# Patient Record
Sex: Female | Born: 1985 | Race: Black or African American | Hispanic: No | Marital: Single | Smoking: Never smoker
Health system: Southern US, Community
[De-identification: ages and names within clinical notes are randomized; demographics above are authoritative.]

## PROBLEM LIST (undated history)

## (undated) ENCOUNTER — Inpatient Hospital Stay (HOSPITAL_COMMUNITY): Payer: Self-pay

## (undated) DIAGNOSIS — K219 Gastro-esophageal reflux disease without esophagitis: Secondary | ICD-10-CM

## (undated) DIAGNOSIS — D649 Anemia, unspecified: Secondary | ICD-10-CM

---
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
## 2006-03-04 ENCOUNTER — Emergency Department (HOSPITAL_COMMUNITY): Admission: EM | Admit: 2006-03-04 | Discharge: 2006-03-04 | Payer: Self-pay | Admitting: Emergency Medicine

## 2006-07-23 ENCOUNTER — Inpatient Hospital Stay (HOSPITAL_COMMUNITY): Admission: AD | Admit: 2006-07-23 | Discharge: 2006-07-23 | Payer: Self-pay | Admitting: Obstetrics & Gynecology

## 2006-08-18 ENCOUNTER — Ambulatory Visit (HOSPITAL_COMMUNITY): Admission: RE | Admit: 2006-08-18 | Discharge: 2006-08-18 | Payer: Self-pay | Admitting: Family Medicine

## 2006-09-15 ENCOUNTER — Ambulatory Visit (HOSPITAL_COMMUNITY): Admission: RE | Admit: 2006-09-15 | Discharge: 2006-09-15 | Payer: Self-pay | Admitting: Family Medicine

## 2006-09-29 ENCOUNTER — Ambulatory Visit (HOSPITAL_COMMUNITY): Admission: RE | Admit: 2006-09-29 | Discharge: 2006-09-29 | Payer: Self-pay | Admitting: Family Medicine

## 2006-10-24 ENCOUNTER — Inpatient Hospital Stay (HOSPITAL_COMMUNITY): Admission: AD | Admit: 2006-10-24 | Discharge: 2006-10-24 | Payer: Self-pay | Admitting: Obstetrics and Gynecology

## 2007-01-28 ENCOUNTER — Inpatient Hospital Stay (HOSPITAL_COMMUNITY): Admission: AD | Admit: 2007-01-28 | Discharge: 2007-01-28 | Payer: Self-pay | Admitting: Obstetrics and Gynecology

## 2007-01-28 ENCOUNTER — Ambulatory Visit: Payer: Self-pay | Admitting: *Deleted

## 2007-03-04 ENCOUNTER — Ambulatory Visit: Payer: Self-pay | Admitting: Obstetrics & Gynecology

## 2007-03-06 ENCOUNTER — Inpatient Hospital Stay (HOSPITAL_COMMUNITY): Admission: AD | Admit: 2007-03-06 | Discharge: 2007-03-07 | Payer: Self-pay | Admitting: Obstetrics and Gynecology

## 2007-03-06 ENCOUNTER — Ambulatory Visit: Payer: Self-pay | Admitting: Obstetrics and Gynecology

## 2007-03-08 ENCOUNTER — Inpatient Hospital Stay (HOSPITAL_COMMUNITY): Admission: AD | Admit: 2007-03-08 | Discharge: 2007-03-12 | Payer: Self-pay | Admitting: Obstetrics & Gynecology

## 2007-03-08 ENCOUNTER — Ambulatory Visit: Payer: Self-pay | Admitting: Obstetrics & Gynecology

## 2007-03-09 ENCOUNTER — Encounter: Payer: Self-pay | Admitting: Obstetrics & Gynecology

## 2007-03-14 ENCOUNTER — Inpatient Hospital Stay (HOSPITAL_COMMUNITY): Admission: AD | Admit: 2007-03-14 | Discharge: 2007-03-14 | Payer: Self-pay | Admitting: Gynecology

## 2008-07-13 ENCOUNTER — Emergency Department (HOSPITAL_COMMUNITY): Admission: EM | Admit: 2008-07-13 | Discharge: 2008-07-13 | Payer: Self-pay | Admitting: Emergency Medicine

## 2008-09-21 ENCOUNTER — Inpatient Hospital Stay (HOSPITAL_COMMUNITY): Admission: AD | Admit: 2008-09-21 | Discharge: 2008-09-21 | Payer: Self-pay | Admitting: Obstetrics & Gynecology

## 2009-03-13 ENCOUNTER — Inpatient Hospital Stay (HOSPITAL_COMMUNITY): Admission: AD | Admit: 2009-03-13 | Discharge: 2009-03-13 | Payer: Self-pay | Admitting: Obstetrics & Gynecology

## 2009-06-17 ENCOUNTER — Ambulatory Visit (HOSPITAL_COMMUNITY): Admission: RE | Admit: 2009-06-17 | Discharge: 2009-06-17 | Payer: Self-pay | Admitting: Obstetrics

## 2009-09-20 ENCOUNTER — Inpatient Hospital Stay (HOSPITAL_COMMUNITY): Admission: AD | Admit: 2009-09-20 | Discharge: 2009-09-21 | Payer: Self-pay | Admitting: Obstetrics

## 2009-09-20 ENCOUNTER — Ambulatory Visit: Payer: Self-pay | Admitting: Family

## 2009-11-02 ENCOUNTER — Inpatient Hospital Stay (HOSPITAL_COMMUNITY)
Admission: AD | Admit: 2009-11-02 | Discharge: 2009-11-06 | Payer: Self-pay | Source: Home / Self Care | Admitting: Obstetrics

## 2010-06-06 LAB — CBC
HCT: 21.1 % — ABNORMAL LOW (ref 36.0–46.0)
HCT: 27.9 % — ABNORMAL LOW (ref 36.0–46.0)
MCH: 30.4 pg (ref 26.0–34.0)
MCH: 30.6 pg (ref 26.0–34.0)
MCV: 89.8 fL (ref 78.0–100.0)
Platelets: 174 10*3/uL (ref 150–400)
RBC: 2.3 MIL/uL — ABNORMAL LOW (ref 3.87–5.11)
RBC: 3.1 MIL/uL — ABNORMAL LOW (ref 3.87–5.11)

## 2010-06-06 LAB — RPR: RPR Ser Ql: NONREACTIVE

## 2010-06-08 LAB — COMPREHENSIVE METABOLIC PANEL
Calcium: 8.8 mg/dL (ref 8.4–10.5)
Chloride: 106 mEq/L (ref 96–112)
GFR calc non Af Amer: >60 mL/min (ref >60)
Potassium: 3.2 mEq/L — ABNORMAL LOW (ref 3.5–5.1)
Total Bilirubin: 0.4 mg/dL (ref 0.3–1.2)

## 2010-06-08 LAB — CBC
HCT: 28.9 % — ABNORMAL LOW (ref 36.0–46.0)
Hemoglobin: 9.9 g/dL — ABNORMAL LOW (ref 12.0–15.0)
MCH: 30.7 pg (ref 26.0–34.0)
MCHC: 34.3 g/dL (ref 30.0–36.0)
MCV: 89.4 fL (ref 78.0–100.0)
WBC: 6.3 10*3/uL (ref 4.0–10.5)

## 2010-06-08 LAB — URINALYSIS, ROUTINE W REFLEX MICROSCOPIC
Glucose, UA: 100 mg/dL — AB
Hgb urine dipstick: NEGATIVE
Ketones, ur: 15 mg/dL — AB
Nitrite: NEGATIVE
Protein, ur: NEGATIVE mg/dL
Specific Gravity, Urine: 1.02 (ref 1.005–1.030)
pH: 6.5 (ref 5.0–8.0)

## 2010-06-08 LAB — URINE MICROSCOPIC-ADD ON

## 2010-06-23 LAB — URINALYSIS, ROUTINE W REFLEX MICROSCOPIC
Bilirubin Urine: NEGATIVE
Glucose, UA: NEGATIVE mg/dL
Nitrite: NEGATIVE
Protein, ur: NEGATIVE mg/dL
pH: 7 (ref 5.0–8.0)

## 2010-06-23 LAB — CBC
Platelets: 259 10*3/uL (ref 150–400)
RBC: 3.55 MIL/uL — ABNORMAL LOW (ref 3.87–5.11)
WBC: 7.6 10*3/uL (ref 4.0–10.5)

## 2010-06-23 LAB — COMPREHENSIVE METABOLIC PANEL
Albumin: 3.5 g/dL (ref 3.5–5.2)
BUN: 9 mg/dL (ref 6–23)
CO2: 26 mEq/L (ref 19–32)
Calcium: 9.3 mg/dL (ref 8.4–10.5)
GFR calc non Af Amer: >60 mL/min (ref >60)
Glucose, Bld: 91 mg/dL (ref 70–99)
Potassium: 3.7 mEq/L (ref 3.5–5.1)

## 2010-06-29 LAB — WET PREP, GENITAL: Yeast Wet Prep HPF POC: NONE SEEN

## 2010-06-29 LAB — POCT PREGNANCY, URINE: Preg Test, Ur: NEGATIVE

## 2010-06-29 LAB — GC/CHLAMYDIA PROBE AMP, GENITAL: Chlamydia, DNA Probe: NEGATIVE

## 2010-07-02 LAB — RAPID STREP SCREEN (MED CTR MEBANE ONLY): Streptococcus, Group A Screen (Direct): POSITIVE — AB

## 2010-08-05 NOTE — Discharge Summary (Signed)
Lori Leon, Lori Leon                  ACCOUNT NO.:  [id number]   MEDICAL RECORD NO.:  [id number]          PATIENT TYPE:  WOC   LOCATION:  WOC                          FACILITY:  [facility]   PHYSICIAN:  Ginger Carne, MD  DATE OF BIRTH:  Jun 26, 1985   DATE OF ADMISSION:  03/04/2007  DATE OF DISCHARGE:  03/04/2007                               DISCHARGE SUMMARY   REASON FOR HOSPITALIZATION:  Post-dates pregnancy for induction.   IN-HOSPITAL PROCEDURES:  Primary low transverse cesarean section.   FINAL DIAGNOSIS:  Primary low transverse cesarean section.   HOSPITAL COURSE:  This patient is a 25 year old gravida 1, para 33  female, [facility] March 04, 2007, who presented on March 08, 2007 for  induction of labor.  The patient progressed to 5 cm without progression,  and a primary cesarean section was performed by the attending on call.  Her postoperative course was complicated by hemoglobin that was 9.4  preoperatively, but remained stable at 6.9 postoperatively.  She was  asymptomatic and specifically had no complaints of dizziness or  difficulty ambulating.  Her abdomen was soft.  Her incision was dry.  Calves without tenderness.  Lungs Clear, and fundus was firm with scant  vaginal flow.  The patient chooses to use condoms and will be followed  at the health department for postpartum visit in 6 to 8 weeks.  Her  condition is stable at discharge.  Routine postoperative instructions  include contacting the office for temperature elevation above 100.4  degrees Fahrenheit.  Increasing abdominal incisional pain, incisional  drainage, heavy vaginal bleeding and/or GI or GU concerns.  She was  prescribed Percocet 5/325 one to two every 4 to 6 hours as needed for  pain, or the use of ibuprofen as needed and to continue her prenatal  vitamins.      Ginger Carne, MD  Electronically Signed     SHB/MEDQ  D:  03/12/2007  T:  03/13/2007  Job:  [id number]

## 2010-08-05 NOTE — Op Note (Signed)
Lori Leon, Lori Leon                  ACCOUNT NO.:  [id number]   MEDICAL RECORD NO.:  [id number]          PATIENT TYPE:  INP   LOCATION:  9120                          [facility]   PHYSICIAN:  Lazaro Arms, M.D.   DATE OF BIRTH:  09/29/1985   DATE OF PROCEDURE:  03/08/2007  DATE OF DISCHARGE:                               OPERATIVE REPORT   PREOPERATIVE DIAGNOSES:  1. Intrauterine pregnancy at 41 weeks of gestation.  2. Post dates induction.  3. Secondary arrest of dilatation and ascent in the active phase of      labor.   POSTOPERATIVE DIAGNOSES:  1. Intrauterine pregnancy at 41 weeks of gestation.  2. Post dates induction.  3. Secondary arrest of dilatation and ascent in the active phase of      labor.  4. Right occipital posterior presentation, asynclitic.   PROCEDURE:  Primary cesarean section after induction of labor.   SURGEON:  Lazaro Arms, M.D.   ANESTHESIA:  Epidural.   FINDINGS:  Over a low transverse hysterotomy incision was delivered a  viable female infant at 2009 with Apgars of 9 and 9.  Weight to be  determined in the nursery.  Cord pH 7.27.  There was a three-vessel  cord.  Cord blood and cord gas were sent.  The uterus, tubes, and  ovaries were normal.   DESCRIPTION OF PROCEDURE:  Patient was taken to the operating room and  placed in a supine position.  Her epidural had been placed previously,  and it was already dosed up to an adequate level.  She was prepped and  draped in the usual sterile fashion.  A Foley catheter had also been  placed previously.  A Pfannenstiel skin incision was made, carried down  sharply to the rectus fascia, which was scored in the midline and  extended laterally.  The fascia was taken off the muscles superiorly and  inferiorly without difficulty.  The muscles were divided, peritoneal  cavity was entered.  An Alexis self-retaining wound retractor was  placed.  The bladder blade was placed.  The vesicouterine serosal flap  was created.  A low transverse hysterotomy incision was made.  Over this  incision was delivered a viable female infant at 2009 with Apgars of 9 and  9 with the weight determined in the nursery.  There was a three-vessel  cord, cord blood, and cord gas sent.  The cord pH was 7.27.  The  placenta was delivered spontaneously.  The uterus was wiped clean with a  clean lap pad and exteriorized.  It was closed in two layers, the first  being a running, interlocking layer, the second being an imbricating  layer.  There was good hemostasis.  The pelvis was irrigated vigorously.  The uterus was replaced through the peritoneal cavity.  The muscles and  peritoneum reapproximated loosely.  The fascia closed using a 0 Vicryl  running.  The subcutaneous tissue was made  hemostatic and irrigated.  The skin was closed using skin staples.  The  patient tolerated the procedure well.  She experienced 700  cc of blood  loss.  She was taken to the recovery room in good, stable condition.  All counts were correct.  She received Ancef prophylactically.      Lazaro Arms, M.D.  Electronically Signed     LHE/MEDQ  D:  03/09/2007  T:  03/10/2007  Job:  [id number]

## 2010-12-26 LAB — CBC
HCT: 20.4 — ABNORMAL LOW
HCT: 23.2 — ABNORMAL LOW
Hemoglobin: 7.9 — CL
Hemoglobin: 9.4 — ABNORMAL LOW
MCHC: 34
MCHC: 34.2
MCHC: 34.2
MCV: 85.3
MCV: 85.4
Platelets: 168
Platelets: 183
Platelets: 227
RBC: 2.72 — ABNORMAL LOW
RBC: 3.23 — ABNORMAL LOW
RDW: 13.7
RDW: 13.8
WBC: 12.6 — ABNORMAL HIGH
WBC: 12.8 — ABNORMAL HIGH
WBC: 8.8

## 2010-12-30 LAB — URINALYSIS, ROUTINE W REFLEX MICROSCOPIC
Nitrite: NEGATIVE
Specific Gravity, Urine: 1.015
Urobilinogen, UA: 0.2
pH: 6

## 2010-12-30 LAB — WET PREP, GENITAL
Clue Cells Wet Prep HPF POC: NONE SEEN
Yeast Wet Prep HPF POC: NONE SEEN

## 2010-12-30 LAB — URINE MICROSCOPIC-ADD ON

## 2010-12-30 LAB — GC/CHLAMYDIA PROBE AMP, GENITAL: Chlamydia, DNA Probe: NEGATIVE

## 2012-02-10 ENCOUNTER — Emergency Department (HOSPITAL_COMMUNITY)
Admission: EM | Admit: 2012-02-10 | Discharge: 2012-02-11 | Disposition: A | Discharge status: Home / Self Care | Payer: Medicare Other | Attending: Emergency Medicine | Admitting: Emergency Medicine

## 2012-02-10 ENCOUNTER — Encounter (HOSPITAL_COMMUNITY): Payer: Self-pay | Admitting: Family Medicine

## 2012-02-10 DIAGNOSIS — R05 Cough: Secondary | ICD-10-CM | POA: Insufficient documentation

## 2012-02-10 DIAGNOSIS — R5381 Other malaise: Secondary | ICD-10-CM | POA: Insufficient documentation

## 2012-02-10 DIAGNOSIS — IMO0001 Reserved for inherently not codable concepts without codable children: Secondary | ICD-10-CM | POA: Insufficient documentation

## 2012-02-10 DIAGNOSIS — B9789 Other viral agents as the cause of diseases classified elsewhere: Secondary | ICD-10-CM | POA: Insufficient documentation

## 2012-02-10 DIAGNOSIS — M549 Dorsalgia, unspecified: Secondary | ICD-10-CM | POA: Insufficient documentation

## 2012-02-10 DIAGNOSIS — R51 Headache: Secondary | ICD-10-CM | POA: Insufficient documentation

## 2012-02-10 DIAGNOSIS — R059 Cough, unspecified: Secondary | ICD-10-CM | POA: Insufficient documentation

## 2012-02-10 DIAGNOSIS — B349 Viral infection, unspecified: Secondary | ICD-10-CM

## 2012-02-10 DIAGNOSIS — R509 Fever, unspecified: Secondary | ICD-10-CM | POA: Insufficient documentation

## 2012-02-10 LAB — URINALYSIS, ROUTINE W REFLEX MICROSCOPIC
Bilirubin Urine: NEGATIVE
Nitrite: NEGATIVE
Specific Gravity, Urine: 1.031 — ABNORMAL HIGH (ref 1.005–1.030)
Urobilinogen, UA: 1 mg/dL (ref 0.0–1.0)
pH: 6 (ref 5.0–8.0)

## 2012-02-10 LAB — URINE MICROSCOPIC-ADD ON

## 2012-02-10 MED ORDER — SODIUM CHLORIDE 0.9 % IV BOLUS (SEPSIS)
1000.0000 mL | Freq: Once | INTRAVENOUS | Status: AC
  Administered 2012-02-10: 1000 mL via INTRAVENOUS

## 2012-02-10 NOTE — ED Notes (Addendum)
Patient states that she has been sick since Sunday night. States she has had fever, chills, body aches, cough and headache. Reports having dizziness since Monday morning. Also reports pain in her neck.

## 2012-02-11 MED ORDER — IBUPROFEN 800 MG PO TABS
800.0000 mg | ORAL_TABLET | Freq: Once | ORAL | Status: AC
  Administered 2012-02-11: 800 mg via ORAL
  Filled 2012-02-11: qty 1

## 2012-02-11 NOTE — ED Provider Notes (Signed)
History     CSN: [id number]  Arrival date & time 02/10/12  2053   First MD Initiated Contact with Patient 02/10/12 2331      Chief Complaint  Patient presents with  . Dizziness   HPI  History provided by the patient. Patient is a 26 year old female with no significant PMH who presents with place of fever, chills, bodyaches, cough and headache began 3 nights ago. Symptoms have been waxing and waning and persistent. Patient has used some over-the-counter congestion and cold medicine without any significant relief. Patient also reports some recent feelings of increased fatigue and lightheadedness. Symptoms are worse with standing and moving around. She denies any near syncope or syncopal episode. She denies any chest pain, shortness of breath or heart palpitations. Cough has been nonproductive. She denies any associated vomiting or diarrhea symptoms. She has no recent travel. She is unsure of any specific sick contacts.     History reviewed. No pertinent past medical history.  Past Surgical History  Procedure Date  . Cesarean section     No family history on file.  History  Substance Use Topics  . Smoking status: Never Smoker   . Smokeless tobacco: Not on file  . Alcohol Use: No    OB History    Grav Para Term Preterm Abortions TAB SAB Ect Mult Living                  Review of Systems  Constitutional: Positive for fever, chills, appetite change and fatigue.  HENT: Negative for congestion, sore throat, rhinorrhea, neck pain and neck stiffness.   Respiratory: Positive for cough. Negative for shortness of breath.   Cardiovascular: Negative for chest pain.  Gastrointestinal: Negative for nausea, vomiting, abdominal pain, diarrhea and constipation.  Genitourinary: Negative for dysuria, frequency, hematuria, flank pain, vaginal bleeding and vaginal discharge.  Musculoskeletal: Positive for myalgias and back pain.  Skin: Negative for rash.  Neurological: Positive for  light-headedness and headaches.  All other systems reviewed and are negative.    Allergies  Review of patient's allergies indicates no known allergies.  Home Medications   Current Outpatient Rx  Name  Route  Sig  Dispense  Refill  . PSEUDOEPHEDRINE-APAP-DM 60-650-20 MG/30ML PO LIQD   Oral   Take 30 mLs by mouth as needed.           BP 111/65  Pulse 88  Temp 100.2 F (37.9 C) (Oral)  Resp 18  SpO2 98%  LMP 01/24/2012  Physical Exam  Nursing note and vitals reviewed. Constitutional: She is oriented to person, place, and time. She appears well-developed and well-nourished. No distress.  HENT:  Head: Normocephalic.  Mouth/Throat: Oropharynx is clear and moist.  Neck: Normal range of motion. Neck supple.       No meningeal signs  Cardiovascular: Normal rate and regular rhythm.   Pulmonary/Chest: Effort normal and breath sounds normal. No respiratory distress. She has no wheezes. She has no rales.  Abdominal: Soft. There is no tenderness. There is no rebound.  Musculoskeletal: She exhibits no edema and no tenderness.  Neurological: She is alert and oriented to person, place, and time.  Skin: Skin is warm and dry. No rash noted.  Psychiatric: She has a normal mood and affect. Her behavior is normal.    ED Course  Procedures   Results for orders placed during the hospital encounter of 02/10/12  URINALYSIS, ROUTINE W REFLEX MICROSCOPIC      Component Value Range   Color, Urine AMBER (*)  YELLOW   APPearance CLOUDY (*) CLEAR   Specific Gravity, Urine 1.031 (*) 1.005 - 1.030   pH 6.0  5.0 - 8.0   Glucose, UA NEGATIVE  NEGATIVE mg/dL   Hgb urine dipstick SMALL (*) NEGATIVE   Bilirubin Urine NEGATIVE  NEGATIVE   Ketones, ur TRACE (*) NEGATIVE mg/dL   Protein, ur 30 (*) NEGATIVE mg/dL   Urobilinogen, UA 1.0  0.0 - 1.0 mg/dL   Nitrite NEGATIVE  NEGATIVE   Leukocytes, UA MODERATE (*) NEGATIVE  POCT PREGNANCY, URINE      Component Value Range   Preg Test, Ur NEGATIVE   NEGATIVE  URINE MICROSCOPIC-ADD ON      Component Value Range   Squamous Epithelial / LPF RARE  RARE   WBC, UA 3-6  <3 WBC/hpf   RBC / HPF 3-6  <3 RBC/hpf   Bacteria, UA RARE  RARE       1. Viral infection       MDM  12:25 AM patient seen and evaluated. Patient appears well and nontoxic. Normal respirations and O2 sats.   Patient reports having improvement of symptoms after fluids. Temperature improved after ibuprofen. At this time suspect viral infection.     Angus Seller, [place] 02/12/12 [phone]

## 2012-02-12 NOTE — ED Provider Notes (Signed)
Medical screening examination/treatment/procedure(s) were performed by non-physician practitioner and as supervising physician I was immediately available for consultation/collaboration.  Strother Everitt M Kimball Manske, MD 02/12/12 0325 

## 2012-11-28 ENCOUNTER — Encounter (HOSPITAL_COMMUNITY): Payer: Self-pay | Admitting: *Deleted

## 2012-11-28 ENCOUNTER — Inpatient Hospital Stay (HOSPITAL_COMMUNITY)
Admission: AD | Admit: 2012-11-28 | Discharge: 2012-11-28 | Disposition: A | Discharge status: Home / Self Care | Payer: Medicare Other | Source: Ambulatory Visit | Attending: Obstetrics & Gynecology | Admitting: Obstetrics & Gynecology

## 2012-11-28 DIAGNOSIS — R0602 Shortness of breath: Secondary | ICD-10-CM | POA: Insufficient documentation

## 2012-11-28 DIAGNOSIS — R109 Unspecified abdominal pain: Secondary | ICD-10-CM | POA: Insufficient documentation

## 2012-11-28 DIAGNOSIS — Z3201 Encounter for pregnancy test, result positive: Secondary | ICD-10-CM

## 2012-11-28 DIAGNOSIS — O99891 Other specified diseases and conditions complicating pregnancy: Secondary | ICD-10-CM | POA: Insufficient documentation

## 2012-11-28 LAB — URINALYSIS, ROUTINE W REFLEX MICROSCOPIC
Glucose, UA: NEGATIVE mg/dL
Hgb urine dipstick: NEGATIVE
Leukocytes, UA: NEGATIVE
pH: 6 (ref 5.0–8.0)

## 2012-11-28 LAB — WET PREP, GENITAL
Clue Cells Wet Prep HPF POC: NONE SEEN
Trich, Wet Prep: NONE SEEN
Yeast Wet Prep HPF POC: NONE SEEN

## 2012-11-28 LAB — POCT PREGNANCY, URINE: Preg Test, Ur: POSITIVE — AB

## 2012-11-28 NOTE — MAU Note (Addendum)
Last couple wks, has gotten short of breath when going up the steps (not currently).  Feel drained. having pain in sides at times, past 1-2 wks, off and on (not currently- but earlier). No period in August.

## 2012-11-28 NOTE — MAU Provider Note (Signed)
History     CSN: [id number]  Arrival date and time: 11/28/12 1327   First Provider Initiated Contact with Patient 11/28/12 1438      Chief Complaint  Patient presents with  . Possible Pregnancy  . Shortness of Breath  . Abdominal Pain   HPI  Ms. Lori Leon is a 27 y.o. female; G3P2002 at [redacted]w[redacted]d who presents to MAU with "suspected pregnancy".  She has been experiencing abdominal cramping and very small amounts of vaginal spotting when she wipes. The pain and spotting have been going on for a few weeks. The spotting is light pink; She last saw the spotting 3-5 days ago. She is experiencing abdominal cramping most days; it hits her at night when she is lays in bed. She is not having any pain or bleeding today. She plans to see Dr. Elsie Leon office for prenatal care.  At times she has shortness of breath with increased activity and with changing positions; none currently, none today.   OB History   Grav Para Term Preterm Abortions TAB SAB Ect Mult Living   3 2 2  0 0 0 0 0 0 2      History reviewed. No pertinent past medical history.  Past Surgical History  Procedure Laterality Date  . Cesarean section      History reviewed. No pertinent family history.  History  Substance Use Topics  . Smoking status: Never Smoker   . Smokeless tobacco: Not on file  . Alcohol Use: No    Allergies: No Known Allergies  Prescriptions prior to admission  Medication Sig Dispense Refill  . acetaminophen (TYLENOL) 325 MG tablet Take 325 mg by mouth every 6 (six) hours as needed for pain (For headache.).      Marland Kitchen IRON PO Take 1 tablet by mouth 3 (three) times a week.       Results for orders placed during the hospital encounter of 11/28/12 (from the past 24 hour(s))  URINALYSIS, ROUTINE W REFLEX MICROSCOPIC     Status: Abnormal   Collection Time    11/28/12  2:03 PM      Result Value Range   Color, Urine YELLOW  YELLOW   APPearance HAZY (*) CLEAR   Specific Gravity, Urine 1.020  1.005 -  1.030   pH 6.0  5.0 - 8.0   Glucose, UA NEGATIVE  NEGATIVE mg/dL   Hgb urine dipstick NEGATIVE  NEGATIVE   Bilirubin Urine NEGATIVE  NEGATIVE   Ketones, ur NEGATIVE  NEGATIVE mg/dL   Protein, ur NEGATIVE  NEGATIVE mg/dL   Urobilinogen, UA 0.2  0.0 - 1.0 mg/dL   Nitrite NEGATIVE  NEGATIVE   Leukocytes, UA NEGATIVE  NEGATIVE  POCT PREGNANCY, URINE     Status: Abnormal   Collection Time    11/28/12  2:07 PM      Result Value Range   Preg Test, Ur POSITIVE (*) NEGATIVE  WET PREP, GENITAL     Status: Abnormal   Collection Time    11/28/12  3:00 PM      Result Value Range   Yeast Wet Prep HPF POC NONE SEEN  NONE SEEN   Trich, Wet Prep NONE SEEN  NONE SEEN   Clue Cells Wet Prep HPF POC NONE SEEN  NONE SEEN   WBC, Wet Prep HPF POC MANY (*) NONE SEEN    Review of Systems  Constitutional: Negative for fever and chills.  Gastrointestinal: Positive for nausea, vomiting, abdominal pain, diarrhea and constipation.  Left sided abdominal pain   Genitourinary: Negative for dysuria, urgency and frequency.  Neurological: Positive for dizziness and headaches.       Dizziness when standing from sitting    Physical Exam   Blood pressure 103/57, pulse 81, temperature 98 F (36.7 C), temperature source Oral, resp. rate 18, height 5' 4.5" (1.638 m), weight 89.359 kg (197 lb), last menstrual period 09/26/2012, SpO2 100.00%. Fetal heart tones 147 bpm by doppler   Physical Exam  Constitutional: She is oriented to person, place, and time. She appears well-developed and well-nourished. No distress.  Neck: Neck supple.  GI: Soft. She exhibits no distension. There is no tenderness. There is no rebound and no guarding.  Top of the fundus approximately 2 finger breaths below umbilicus   Genitourinary: No vaginal discharge found.  Speculum exam: Vagina - Small amount of creamy discharge, no odor.  Cervix - No contact bleeding Bimanual exam: Cervix closed Uterus non tender, normal size Adnexa non  tender, no masses bilaterally GC/Chlam, wet prep done Chaperone present for exam.   Neurological: She is alert and oriented to person, place, and time.  Skin: Skin is warm. She is not diaphoretic.    MAU Course  Procedures  MDM UA UPT +fht Wet prep GC/Chlamydia- pending    Assessment and Plan  A: Positive urine pregnancy test IUP; +fht    P: Discharge home Call Dr. Elsie Leon office to schedule your prenatal appointment  Come back to MAU tomorrow at 1500 for [place] for dating Take your prenatal vitamin daily.  Return with worsening symptoms Your pregnancy test is positive.  No smoking, no drugs, no alcohol.  Take a prenatal vitamin one by mouth every day.  Eat small frequent snacks to avoid nausea.  Begin prenatal care as soon as possible.  [name] IRENE FNP-C 11/28/2012, 6:43 PM

## 2012-11-28 NOTE — MAU Note (Signed)
Pt states some lower abdominal cramping at times.

## 2012-11-29 ENCOUNTER — Other Ambulatory Visit (HOSPITAL_COMMUNITY): Payer: Self-pay | Admitting: Obstetrics and Gynecology

## 2012-11-29 ENCOUNTER — Ambulatory Visit (HOSPITAL_COMMUNITY)
Admission: RE | Admit: 2012-11-29 | Discharge: 2012-11-29 | Disposition: A | Discharge status: Home / Self Care | Payer: Medicare Other | Source: Ambulatory Visit | Attending: Obstetrics and Gynecology | Admitting: Obstetrics and Gynecology

## 2012-11-29 ENCOUNTER — Ambulatory Visit (HOSPITAL_COMMUNITY)
Admit: 2012-11-29 | Discharge: 2012-11-29 | Disposition: A | Discharge status: Home / Self Care | Payer: Medicare Other | Attending: Obstetrics and Gynecology | Admitting: Obstetrics and Gynecology

## 2012-11-29 DIAGNOSIS — Z3201 Encounter for pregnancy test, result positive: Secondary | ICD-10-CM

## 2012-11-29 DIAGNOSIS — O34219 Maternal care for unspecified type scar from previous cesarean delivery: Secondary | ICD-10-CM | POA: Insufficient documentation

## 2012-11-29 DIAGNOSIS — Z3689 Encounter for other specified antenatal screening: Secondary | ICD-10-CM | POA: Insufficient documentation

## 2013-01-09 ENCOUNTER — Other Ambulatory Visit (HOSPITAL_COMMUNITY): Payer: Self-pay | Admitting: Nurse Practitioner

## 2013-01-09 DIAGNOSIS — Z3689 Encounter for other specified antenatal screening: Secondary | ICD-10-CM

## 2013-01-09 LAB — OB RESULTS CONSOLE HEPATITIS B SURFACE ANTIGEN: Hepatitis B Surface Ag: NEGATIVE

## 2013-01-09 LAB — OB RESULTS CONSOLE RUBELLA ANTIBODY, IGM: RUBELLA: NON-IMMUNE/NOT IMMUNE

## 2013-01-09 LAB — OB RESULTS CONSOLE RPR: RPR: NONREACTIVE

## 2013-01-09 LAB — OB RESULTS CONSOLE ANTIBODY SCREEN: Antibody Screen: NEGATIVE

## 2013-01-09 LAB — OB RESULTS CONSOLE ABO/RH: RH Type: POSITIVE

## 2013-01-09 LAB — OB RESULTS CONSOLE HIV ANTIBODY (ROUTINE TESTING): HIV: NONREACTIVE

## 2013-01-11 ENCOUNTER — Ambulatory Visit (HOSPITAL_COMMUNITY)
Admission: RE | Admit: 2013-01-11 | Discharge: 2013-01-11 | Disposition: A | Discharge status: Home / Self Care | Payer: Medicare Other | Source: Ambulatory Visit | Attending: Nurse Practitioner | Admitting: Nurse Practitioner

## 2013-01-11 ENCOUNTER — Other Ambulatory Visit (HOSPITAL_COMMUNITY): Payer: Self-pay | Admitting: Nurse Practitioner

## 2013-01-11 DIAGNOSIS — Z3689 Encounter for other specified antenatal screening: Secondary | ICD-10-CM | POA: Insufficient documentation

## 2013-01-11 DIAGNOSIS — O34219 Maternal care for unspecified type scar from previous cesarean delivery: Secondary | ICD-10-CM | POA: Insufficient documentation

## 2013-02-06 ENCOUNTER — Other Ambulatory Visit (HOSPITAL_COMMUNITY): Payer: Self-pay | Admitting: Nurse Practitioner

## 2013-02-06 DIAGNOSIS — Z3689 Encounter for other specified antenatal screening: Secondary | ICD-10-CM

## 2013-02-09 ENCOUNTER — Ambulatory Visit (HOSPITAL_COMMUNITY)
Admission: RE | Admit: 2013-02-09 | Discharge: 2013-02-09 | Disposition: A | Discharge status: Home / Self Care | Payer: Medicare Other | Source: Ambulatory Visit | Attending: Nurse Practitioner | Admitting: Nurse Practitioner

## 2013-02-09 ENCOUNTER — Ambulatory Visit (HOSPITAL_COMMUNITY): Admission: RE | Admit: 2013-02-09 | Payer: Medicare Other | Source: Ambulatory Visit

## 2013-02-09 DIAGNOSIS — O365990 Maternal care for other known or suspected poor fetal growth, unspecified trimester, not applicable or unspecified: Secondary | ICD-10-CM | POA: Insufficient documentation

## 2013-02-09 DIAGNOSIS — Z3689 Encounter for other specified antenatal screening: Secondary | ICD-10-CM | POA: Insufficient documentation

## 2013-03-10 ENCOUNTER — Other Ambulatory Visit: Payer: Self-pay | Admitting: Obstetrics & Gynecology

## 2013-04-05 ENCOUNTER — Encounter: Payer: Self-pay | Admitting: *Deleted

## 2013-04-20 ENCOUNTER — Other Ambulatory Visit (HOSPITAL_COMMUNITY): Payer: Self-pay | Admitting: Nurse Practitioner

## 2013-04-20 DIAGNOSIS — O321XX0 Maternal care for breech presentation, not applicable or unspecified: Secondary | ICD-10-CM

## 2013-04-21 ENCOUNTER — Ambulatory Visit (HOSPITAL_COMMUNITY)
Admission: RE | Admit: 2013-04-21 | Discharge: 2013-04-21 | Disposition: A | Discharge status: Home / Self Care | Payer: Medicare Other | Source: Ambulatory Visit | Attending: Nurse Practitioner | Admitting: Nurse Practitioner

## 2013-04-21 DIAGNOSIS — Z3689 Encounter for other specified antenatal screening: Secondary | ICD-10-CM | POA: Insufficient documentation

## 2013-04-21 DIAGNOSIS — O321XX0 Maternal care for breech presentation, not applicable or unspecified: Secondary | ICD-10-CM | POA: Insufficient documentation

## 2013-04-25 ENCOUNTER — Encounter (HOSPITAL_COMMUNITY): Payer: Self-pay | Admitting: Pharmacist

## 2013-05-04 ENCOUNTER — Encounter (HOSPITAL_COMMUNITY): Payer: Self-pay

## 2013-05-05 NOTE — Patient Instructions (Signed)
Your procedure is scheduled on: Wednesday, Feb, 18, 2015  [facility] at: 9:00am  Pick up the phone at the desk and dial [phone].  Call this number if you have problems the morning of surgery: [phone].  Remember: Do NOT eat food: AFTER MIDNIGHT TUESDAY Do NOT drink clear liquids after: AFTER MIDNIGHT TUESDAY  Take these medicines the morning of surgery with a SIP OF WATER: NONE  Do NOT wear jewelry (body piercing), make-up, or nail polish. Do NOT wear lotions, powders, or perfumes.  You may wear deoderant. Do NOT shave for 48 hours prior to surgery. Do NOT bring valuables to the hospital. Contacts, dentures, or bridgework may not be worn into surgery. Leave suitcase in car.  After surgery it may be brought to your room.  For patients admitted to the hospital, checkout time is 11:00 AM the day of discharge.

## 2013-05-08 ENCOUNTER — Encounter (HOSPITAL_COMMUNITY)
Admission: RE | Admit: 2013-05-08 | Discharge: 2013-05-08 | Disposition: A | Discharge status: Home / Self Care | Payer: Medicare Other | Source: Ambulatory Visit | Attending: Obstetrics & Gynecology | Admitting: Obstetrics & Gynecology

## 2013-05-08 ENCOUNTER — Encounter (HOSPITAL_COMMUNITY): Payer: Self-pay

## 2013-05-08 ENCOUNTER — Inpatient Hospital Stay (HOSPITAL_COMMUNITY)
Admission: RE | Admit: 2013-05-08 | Discharge: 2013-05-12 | DRG: 766 | Disposition: A | Discharge status: Home / Self Care | Payer: Medicare Other | Source: Ambulatory Visit | Attending: Obstetrics & Gynecology | Admitting: Obstetrics & Gynecology

## 2013-05-08 DIAGNOSIS — O9902 Anemia complicating childbirth: Secondary | ICD-10-CM | POA: Diagnosis present

## 2013-05-08 DIAGNOSIS — D649 Anemia, unspecified: Secondary | ICD-10-CM | POA: Diagnosis present

## 2013-05-08 DIAGNOSIS — O34219 Maternal care for unspecified type scar from previous cesarean delivery: Principal | ICD-10-CM | POA: Diagnosis present

## 2013-05-08 DIAGNOSIS — K219 Gastro-esophageal reflux disease without esophagitis: Secondary | ICD-10-CM | POA: Diagnosis present

## 2013-05-08 HISTORY — DX: Anemia, unspecified: D64.9

## 2013-05-08 HISTORY — DX: Gastro-esophageal reflux disease without esophagitis: K21.9

## 2013-05-08 LAB — CBC
HEMATOCRIT: 26.4 % — AB (ref 36.0–46.0)
HEMOGLOBIN: 8.9 g/dL — AB (ref 12.0–15.0)
MCH: 29 pg (ref 26.0–34.0)
MCHC: 33.7 g/dL (ref 30.0–36.0)
MCV: 86 fL (ref 78.0–100.0)
Platelets: 146 10*3/uL — ABNORMAL LOW (ref 150–400)
RBC: 3.07 MIL/uL — AB (ref 3.87–5.11)
RDW: 13.3 % (ref 11.5–15.5)
WBC: 7.7 10*3/uL (ref 4.0–10.5)

## 2013-05-08 LAB — ABO/RH: ABO/RH(D): A POS

## 2013-05-08 LAB — TYPE AND SCREEN
ABO/RH(D): A POS
Antibody Screen: NEGATIVE

## 2013-05-08 LAB — SYPHILIS: RPR W/REFLEX TO RPR TITER AND TREPONEMAL ANTIBODIES, TRADITIONAL SCREENING AND DIAGNOSIS ALGORITHM: RPR Ser Ql: NONREACTIVE

## 2013-05-08 NOTE — Pre-Procedure Instructions (Signed)
Dr. Maple HudsonMoser made aware of patients hemoglobin 8.9, hematocrit 26.4, and platelets 146 no new orders received at this time.

## 2013-05-10 ENCOUNTER — Encounter (HOSPITAL_COMMUNITY)
Admission: RE | Disposition: A | Discharge status: Home / Self Care | Payer: Self-pay | Source: Ambulatory Visit | Attending: Obstetrics & Gynecology

## 2013-05-10 ENCOUNTER — Encounter (HOSPITAL_COMMUNITY): Payer: Self-pay

## 2013-05-10 ENCOUNTER — Inpatient Hospital Stay (HOSPITAL_COMMUNITY): Payer: Medicare Other | Admitting: Anesthesiology

## 2013-05-10 ENCOUNTER — Encounter (HOSPITAL_COMMUNITY): Payer: Medicare Other | Admitting: Anesthesiology

## 2013-05-10 DIAGNOSIS — Z302 Encounter for sterilization: Secondary | ICD-10-CM

## 2013-05-10 DIAGNOSIS — O34219 Maternal care for unspecified type scar from previous cesarean delivery: Secondary | ICD-10-CM

## 2013-05-10 SURGERY — Surgical Case
Anesthesia: Spinal | Site: Abdomen | Laterality: Bilateral

## 2013-05-10 MED ORDER — MENTHOL 3 MG MT LOZG: 1.0000 | LOZENGE | OROMUCOSAL | Status: DC | PRN

## 2013-05-10 MED ORDER — CEFAZOLIN SODIUM-DEXTROSE 2-3 GM-% IV SOLR
2.0000 g | INTRAVENOUS | Status: AC
  Administered 2013-05-10: 2 g via INTRAVENOUS

## 2013-05-10 MED ORDER — KETOROLAC TROMETHAMINE 30 MG/ML IJ SOLN
30.0000 mg | Freq: Four times a day (QID) | INTRAMUSCULAR | Status: AC | PRN
  Administered 2013-05-10: 30 mg via INTRAMUSCULAR

## 2013-05-10 MED ORDER — SIMETHICONE 80 MG PO CHEW
80.0000 mg | CHEWABLE_TABLET | ORAL | Status: DC
  Administered 2013-05-11 (×2): 80 mg via ORAL
  Filled 2013-05-10 (×2): qty 1

## 2013-05-10 MED ORDER — LACTATED RINGERS IV SOLN
Freq: Once | INTRAVENOUS | Status: AC
  Administered 2013-05-10: 10:00:00 via INTRAVENOUS

## 2013-05-10 MED ORDER — MORPHINE SULFATE (PF) 0.5 MG/ML IJ SOLN
INTRAMUSCULAR | Status: DC | PRN
  Administered 2013-05-10: .2 mg via INTRATHECAL

## 2013-05-10 MED ORDER — METOCLOPRAMIDE HCL 5 MG/ML IJ SOLN: 10.0000 mg | Freq: Three times a day (TID) | INTRAMUSCULAR | Status: DC | PRN

## 2013-05-10 MED ORDER — NALOXONE HCL 1 MG/ML IJ SOLN
1.0000 ug/kg/h | INTRAVENOUS | Status: DC | PRN
  Filled 2013-05-10: qty 2

## 2013-05-10 MED ORDER — MEPERIDINE HCL 25 MG/ML IJ SOLN: 6.2500 mg | INTRAMUSCULAR | Status: DC | PRN

## 2013-05-10 MED ORDER — PHENYLEPHRINE HCL 10 MG/ML IJ SOLN
INTRAMUSCULAR | Status: AC
  Filled 2013-05-10: qty 1

## 2013-05-10 MED ORDER — NALBUPHINE HCL 10 MG/ML IJ SOLN: 5.0000 mg | INTRAMUSCULAR | Status: DC | PRN

## 2013-05-10 MED ORDER — SCOPOLAMINE 1 MG/3DAYS TD PT72
1.0000 | MEDICATED_PATCH | Freq: Once | TRANSDERMAL | Status: DC
  Administered 2013-05-10: 1.5 mg via TRANSDERMAL

## 2013-05-10 MED ORDER — KETOROLAC TROMETHAMINE 30 MG/ML IJ SOLN: 30.0000 mg | Freq: Four times a day (QID) | INTRAMUSCULAR | Status: AC | PRN

## 2013-05-10 MED ORDER — PRENATAL MULTIVITAMIN CH
1.0000 | ORAL_TABLET | Freq: Every day | ORAL | Status: DC
  Administered 2013-05-11: 1 via ORAL
  Filled 2013-05-10: qty 1

## 2013-05-10 MED ORDER — FENTANYL CITRATE 0.05 MG/ML IJ SOLN: 25.0000 ug | INTRAMUSCULAR | Status: DC | PRN

## 2013-05-10 MED ORDER — SODIUM CHLORIDE 0.9 % IJ SOLN: 3.0000 mL | INTRAMUSCULAR | Status: DC | PRN

## 2013-05-10 MED ORDER — SENNOSIDES-DOCUSATE SODIUM 8.6-50 MG PO TABS
2.0000 | ORAL_TABLET | ORAL | Status: DC
  Administered 2013-05-11 (×2): 2 via ORAL
  Filled 2013-05-10 (×2): qty 2

## 2013-05-10 MED ORDER — MORPHINE SULFATE 0.5 MG/ML IJ SOLN
INTRAMUSCULAR | Status: AC
  Filled 2013-05-10: qty 10

## 2013-05-10 MED ORDER — SCOPOLAMINE 1 MG/3DAYS TD PT72
1.0000 | MEDICATED_PATCH | Freq: Once | TRANSDERMAL | Status: DC
  Filled 2013-05-10: qty 1

## 2013-05-10 MED ORDER — LACTATED RINGERS IV SOLN
INTRAVENOUS | Status: DC
  Administered 2013-05-10: 17:00:00 via INTRAVENOUS

## 2013-05-10 MED ORDER — ONDANSETRON HCL 4 MG/2ML IJ SOLN: 4.0000 mg | Freq: Three times a day (TID) | INTRAMUSCULAR | Status: DC | PRN

## 2013-05-10 MED ORDER — DIPHENHYDRAMINE HCL 50 MG/ML IJ SOLN: 12.5000 mg | INTRAMUSCULAR | Status: DC | PRN

## 2013-05-10 MED ORDER — FENTANYL CITRATE 0.05 MG/ML IJ SOLN
INTRAMUSCULAR | Status: DC | PRN
  Administered 2013-05-10: 12.5 ug via INTRATHECAL

## 2013-05-10 MED ORDER — KETOROLAC TROMETHAMINE 30 MG/ML IJ SOLN
INTRAMUSCULAR | Status: AC
  Administered 2013-05-10: 30 mg via INTRAMUSCULAR
  Filled 2013-05-10: qty 1

## 2013-05-10 MED ORDER — DIBUCAINE 1 % RE OINT: 1.0000 "application " | TOPICAL_OINTMENT | RECTAL | Status: DC | PRN

## 2013-05-10 MED ORDER — LIDOCAINE 1%/NA BICARB 0.1 MEQ INJECTION
INJECTION | INTRAVENOUS | Status: AC
  Filled 2013-05-10: qty 1

## 2013-05-10 MED ORDER — DIPHENHYDRAMINE HCL 25 MG PO CAPS: 25.0000 mg | ORAL_CAPSULE | ORAL | Status: DC | PRN

## 2013-05-10 MED ORDER — LACTATED RINGERS IV SOLN
INTRAVENOUS | Status: DC
  Administered 2013-05-10 (×2): via INTRAVENOUS

## 2013-05-10 MED ORDER — SIMETHICONE 80 MG PO CHEW
80.0000 mg | CHEWABLE_TABLET | ORAL | Status: DC | PRN
Start: 2013-05-10 — End: 2013-05-12

## 2013-05-10 MED ORDER — ONDANSETRON HCL 4 MG/2ML IJ SOLN
INTRAMUSCULAR | Status: AC
  Filled 2013-05-10: qty 2

## 2013-05-10 MED ORDER — IBUPROFEN 600 MG PO TABS
600.0000 mg | ORAL_TABLET | Freq: Four times a day (QID) | ORAL | Status: DC
  Administered 2013-05-10 – 2013-05-12 (×7): 600 mg via ORAL
  Filled 2013-05-10 (×7): qty 1

## 2013-05-10 MED ORDER — ONDANSETRON HCL 4 MG/2ML IJ SOLN: 4.0000 mg | INTRAMUSCULAR | Status: DC | PRN

## 2013-05-10 MED ORDER — DIPHENHYDRAMINE HCL 25 MG PO CAPS: 25.0000 mg | ORAL_CAPSULE | Freq: Four times a day (QID) | ORAL | Status: DC | PRN

## 2013-05-10 MED ORDER — DIPHENHYDRAMINE HCL 50 MG/ML IJ SOLN: 25.0000 mg | INTRAMUSCULAR | Status: DC | PRN

## 2013-05-10 MED ORDER — WITCH HAZEL-GLYCERIN EX PADS: 1.0000 "application " | MEDICATED_PAD | CUTANEOUS | Status: DC | PRN

## 2013-05-10 MED ORDER — NALOXONE HCL 0.4 MG/ML IJ SOLN: 0.4000 mg | INTRAMUSCULAR | Status: DC | PRN

## 2013-05-10 MED ORDER — OXYCODONE-ACETAMINOPHEN 5-325 MG PO TABS
1.0000 | ORAL_TABLET | ORAL | Status: DC | PRN
  Administered 2013-05-11: 1 via ORAL
  Filled 2013-05-10: qty 1

## 2013-05-10 MED ORDER — SCOPOLAMINE 1 MG/3DAYS TD PT72
MEDICATED_PATCH | TRANSDERMAL | Status: AC
  Administered 2013-05-10: 1.5 mg via TRANSDERMAL
  Filled 2013-05-10: qty 1

## 2013-05-10 MED ORDER — OXYTOCIN 10 UNIT/ML IJ SOLN
INTRAMUSCULAR | Status: AC
  Filled 2013-05-10: qty 4

## 2013-05-10 MED ORDER — OXYTOCIN 10 UNIT/ML IJ SOLN
40.0000 [IU] | INTRAVENOUS | Status: DC | PRN
  Administered 2013-05-10: 40 [IU] via INTRAVENOUS

## 2013-05-10 MED ORDER — ZOLPIDEM TARTRATE 5 MG PO TABS: 5.0000 mg | ORAL_TABLET | Freq: Every evening | ORAL | Status: DC | PRN

## 2013-05-10 MED ORDER — ONDANSETRON HCL 4 MG PO TABS: 4.0000 mg | ORAL_TABLET | ORAL | Status: DC | PRN

## 2013-05-10 MED ORDER — TETANUS-DIPHTH-ACELL PERTUSSIS 5-2.5-18.5 LF-MCG/0.5 IM SUSP
0.5000 mL | Freq: Once | INTRAMUSCULAR | Status: AC
  Administered 2013-05-12: 0.5 mL via INTRAMUSCULAR
  Filled 2013-05-10: qty 0.5

## 2013-05-10 MED ORDER — BUPIVACAINE HCL (PF) 0.25 % IJ SOLN
INTRAMUSCULAR | Status: AC
  Filled 2013-05-10: qty 30

## 2013-05-10 MED ORDER — LACTATED RINGERS IV SOLN
INTRAVENOUS | Status: DC
  Administered 2013-05-10: 11:00:00 via INTRAVENOUS

## 2013-05-10 MED ORDER — FENTANYL CITRATE 0.05 MG/ML IJ SOLN
INTRAMUSCULAR | Status: AC
  Filled 2013-05-10: qty 2

## 2013-05-10 MED ORDER — PHENYLEPHRINE 8 MG IN D5W 100 ML (0.08MG/ML) PREMIX OPTIME
INJECTION | INTRAVENOUS | Status: DC | PRN
  Administered 2013-05-10 (×2): 60 ug/min via INTRAVENOUS

## 2013-05-10 MED ORDER — BUPIVACAINE HCL 0.25 % IJ SOLN
INTRAMUSCULAR | Status: DC | PRN
  Administered 2013-05-10: 30 mL

## 2013-05-10 MED ORDER — BUPIVACAINE IN DEXTROSE 0.75-8.25 % IT SOLN
INTRATHECAL | Status: DC | PRN
Start: 2013-05-10 — End: 2013-05-10
  Administered 2013-05-10: 1.8 mL via INTRATHECAL

## 2013-05-10 MED ORDER — LANOLIN HYDROUS EX OINT: 1.0000 "application " | TOPICAL_OINTMENT | CUTANEOUS | Status: DC | PRN

## 2013-05-10 MED ORDER — SIMETHICONE 80 MG PO CHEW
80.0000 mg | CHEWABLE_TABLET | Freq: Three times a day (TID) | ORAL | Status: DC
  Administered 2013-05-10 – 2013-05-12 (×5): 80 mg via ORAL
  Filled 2013-05-10 (×5): qty 1

## 2013-05-10 MED ORDER — ONDANSETRON HCL 4 MG/2ML IJ SOLN
INTRAMUSCULAR | Status: DC | PRN
  Administered 2013-05-10: 4 mg via INTRAVENOUS

## 2013-05-10 MED ORDER — OXYTOCIN 40 UNITS IN LACTATED RINGERS INFUSION - SIMPLE MED: 62.5000 mL/h | INTRAVENOUS | Status: AC

## 2013-05-10 SURGICAL SUPPLY — 30 items
BARRIER ADHS 3X4 INTERCEED (GAUZE/BANDAGES/DRESSINGS) IMPLANT
CLAMP CORD UMBIL (MISCELLANEOUS) IMPLANT
CLIP FILSHIE TUBAL LIGA STRL (Clip) ×3 IMPLANT
CLOTH BEACON ORANGE TIMEOUT ST (SAFETY) ×3 IMPLANT
DRAPE LG THREE QUARTER DISP (DRAPES) IMPLANT
DRSG OPSITE POSTOP 4X10 (GAUZE/BANDAGES/DRESSINGS) ×3 IMPLANT
DURAPREP 26ML APPLICATOR (WOUND CARE) ×3 IMPLANT
ELECT REM PT RETURN 9FT ADLT (ELECTROSURGICAL) ×3
ELECTRODE REM PT RTRN 9FT ADLT (ELECTROSURGICAL) ×1 IMPLANT
EXTRACTOR VACUUM KIWI (MISCELLANEOUS) IMPLANT
GLOVE BIO SURGEON STRL SZ 6.5 (GLOVE) ×2 IMPLANT
GLOVE BIO SURGEONS STRL SZ 6.5 (GLOVE) ×1
GLOVE BIOGEL PI IND STRL 7.0 (GLOVE) ×1 IMPLANT
GLOVE BIOGEL PI INDICATOR 7.0 (GLOVE) ×2
GOWN STRL REUS W/TWL LRG LVL3 (GOWN DISPOSABLE) ×6 IMPLANT
KIT ABG SYR 3ML LUER SLIP (SYRINGE) IMPLANT
NEEDLE HYPO 22GX1.5 SAFETY (NEEDLE) ×3 IMPLANT
NEEDLE HYPO 25X5/8 SAFETYGLIDE (NEEDLE) IMPLANT
NS IRRIG 1000ML POUR BTL (IV SOLUTION) ×3 IMPLANT
PACK C SECTION WH (CUSTOM PROCEDURE TRAY) ×3 IMPLANT
PAD OB MATERNITY 4.3X12.25 (PERSONAL CARE ITEMS) ×3 IMPLANT
RTRCTR C-SECT PINK 25CM LRG (MISCELLANEOUS) ×3 IMPLANT
SUT VIC AB 0 CT1 36 (SUTURE) ×12 IMPLANT
SUT VIC AB 2-0 CT1 27 (SUTURE) ×2
SUT VIC AB 2-0 CT1 TAPERPNT 27 (SUTURE) ×1 IMPLANT
SUT VIC AB 4-0 PS2 27 (SUTURE) ×3 IMPLANT
SYR CONTROL 10ML LL (SYRINGE) ×3 IMPLANT
TOWEL OR 17X24 6PK STRL BLUE (TOWEL DISPOSABLE) ×3 IMPLANT
TRAY FOLEY CATH 14FR (SET/KITS/TRAYS/PACK) IMPLANT
WATER STERILE IRR 1000ML POUR (IV SOLUTION) ×3 IMPLANT

## 2013-05-10 NOTE — Addendum Note (Signed)
Addendum created 05/10/13 1544 by Algis GreenhouseLinda A Neka Bise, CRNA   Modules edited: Notes Section   Notes Section:  File: [id number]

## 2013-05-10 NOTE — Op Note (Signed)
Cesarean Section Procedure Note   Lori Leon  05/10/2013  Indications: Scheduled Proceedure/Maternal Request   Pre-operative Diagnosis:  REPEAT c/section; desires sterilization.   Post-operative Diagnosis: Same   Surgeon: Surgeon(s) and Role:    * Lori PhenixJames G Estephania Licciardi, MD - Primary   Assistants: none  Anesthesia: spinal   Procedure Details:  The patient was seen in the Holding Room. The risks, benefits, complications, treatment options, and expected outcomes were discussed with the patient. The patient concurred with the proposed plan, giving informed consent. identified as Lori Leon and the procedure verified as C-Section Delivery. A Time Out was held and the above information confirmed.  After induction of anesthesia, the patient was draped and prepped in the usual sterile manner. A transverse was made and carried down through the subcutaneous tissue to the fascia. Fascial incision was made and extended transversely. The fascia was separated from the underlying rectus tissue superiorly and inferiorly. The peritoneum was identified and entered. Peritoneal incision was extended longitudinally. The utero-vesical peritoneal reflection was incised transversely and the bladder flap was bluntly freed from the lower uterine segment. A low transverse uterine incision was made. Delivered from cephalic presentation was a 3415 gram Female with Apgar scores of 9 at one minute and 9 at five minutes. Cord ph was not sent the umbilical cord was clamped and cut cord blood was obtained for evaluation. The placenta was removed Intact and appeared normal. The uterine outline, tubes and ovaries appeared normal}. The uterine incision was closed with running locked sutures of 0coated Vicryl, and imbricating layer followed.   Hemostasis was observed. Both adnexa were inspected and the fallopian tubes were identified and elevated and Filshie clips were applied for sterilization.  The peritoneum was closed with a running  suture with 2-0 Vicryl. The fascia was then reapproximated with running sutures of 0coated Vicryl. The subcuticular closure was performed using 4-0coated Vicryl. Sterile dressing was applied  Instrument, sponge, and needle counts were correct prior the abdominal closure and were correct at the conclusion of the case.    Findings:   Estimated Blood Loss: * No blood loss amount entered * 700 ml  Total IV Fluids: 1000ml   Urine Output: 100CC OF clear urine  Specimens: none  Complications: no complications  Disposition: PACU - hemodynamically stable.   Maternal Condition: stable   Baby condition / location:  Couplet care / Skin to Skin  Attending Attestation: I was present and scrubbed for the entire procedure.   Signed: Surgeon(s): Lori PhenixJames G Krissy Orebaugh, MD 05/10/2013

## 2013-05-10 NOTE — Transfer of Care (Signed)
Immediate Anesthesia Transfer of Care Note  Patient: Lori Leon  Procedure(s) Performed: Procedure(s): REPEAT CESAREAN SECTION with Bilateral Tubal Ligation (Bilateral)  Patient Location: PACU  Anesthesia Type:Spinal  Level of Consciousness: awake, alert , oriented and patient cooperative  Airway & Oxygen Therapy: Patient Spontanous Breathing  Post-op Assessment: Report given to PACU RN and Post -op Vital signs reviewed and stable  Post vital signs: Reviewed and stable  Complications: No apparent anesthesia complications

## 2013-05-10 NOTE — Anesthesia Procedure Notes (Signed)
Spinal  Patient location during procedure: OR Start time: 05/10/2013 10:42 AM End time: 05/10/2013 11:47 AM Staffing Anesthesiologist: MOSER, CHRIS Performed by: anesthesiologist  Preanesthetic Checklist Completed: patient identified, surgical consent, pre-op evaluation, timeout performed, IV checked, risks and benefits discussed and monitors and equipment checked Spinal Block Patient position: sitting Prep: site prepped and draped and DuraPrep Patient monitoring: heart rate, cardiac monitor, continuous pulse ox and blood pressure Approach: midline Location: L3-4 Injection technique: single-shot Needle Needle type: Pencan  Needle gauge: 24 G Needle length: 10 cm Assessment Sensory level: T4

## 2013-05-10 NOTE — Progress Notes (Signed)
UR completed 

## 2013-05-10 NOTE — Anesthesia Preprocedure Evaluation (Signed)
Anesthesia Evaluation  Patient identified by MRN, date of birth, ID band Patient awake    Reviewed: Allergy & Precautions, H&P , NPO status , Patient's Chart, lab work & pertinent test results  History of Anesthesia Complications Negative for: history of anesthetic complications  Airway Mallampati: II TM Distance: >3 FB Neck ROM: Full    Dental  (+) Teeth Intact   Pulmonary neg pulmonary ROS,    Pulmonary exam normal       Cardiovascular negative cardio ROS  Rhythm:Regular Rate:Normal     Neuro/Psych negative neurological ROS  negative psych ROS   GI/Hepatic negative GI ROS, Neg liver ROS,   Endo/Other  negative endocrine ROS  Renal/GU negative Renal ROS     Musculoskeletal negative musculoskeletal ROS (+)   Abdominal   Peds  Hematology  (+) anemia ,   Anesthesia Other Findings   Reproductive/Obstetrics (+) Pregnancy                           Anesthesia Physical Anesthesia Plan  ASA: II  Anesthesia Plan: Spinal   Post-op Pain Management:    Induction:   Airway Management Planned: Natural Airway  Additional Equipment: None  Intra-op Plan:   Post-operative Plan:   Informed Consent: I have reviewed the patients History and Physical, chart, labs and discussed the procedure including the risks, benefits and alternatives for the proposed anesthesia with the patient or authorized representative who has indicated his/her understanding and acceptance.   Dental advisory given  Plan Discussed with: CRNA and Surgeon  Anesthesia Plan Comments:         Anesthesia Quick Evaluation

## 2013-05-10 NOTE — H&P (Signed)
Lori Leon is a 28 y.o. female presenting for repeat cesarean section and bilateral tubal ligation. Maternal Medical History:  Reason for admission: Repeat cesarean section and BTL at 39 weeks  Fetal activity: Perceived fetal activity is normal.    Prenatal complications: Late to care, 2 previous cesarean sections    OB History   Grav Para Term Preterm Abortions TAB SAB Ect Mult Living   3 2 2  0 0 0 0 0 0 2     Past Medical History  Diagnosis Date  . GERD (gastroesophageal reflux disease)   . Anemia    Past Surgical History  Procedure Laterality Date  . Cesarean section     Family History: family history is not on file. Social History:  reports that she has never smoked. She does not have any smokeless tobacco history on file. She reports that she does not drink alcohol or use illicit drugs.   Prenatal Transfer Tool  Maternal Diabetes: No Genetic Screening: Declined Maternal Ultrasounds/Referrals: Normal Fetal Ultrasounds or other Referrals:  None Maternal Substance Abuse:  No Significant Maternal Medications:  None Significant Maternal Lab Results:  None Other Comments:  None  Review of Systems  Constitutional: Negative for fever.  Respiratory: Negative.   Gastrointestinal: Negative.   Genitourinary: Negative.   Musculoskeletal: Negative.       Blood pressure 119/65, pulse 93, temperature 97.7 F (36.5 C), temperature source Oral, resp. rate 16, last menstrual period 08/03/2012, SpO2 95.00%. Maternal Exam:  Abdomen: Patient reports no abdominal tenderness. Surgical scars: low transverse.   Fetal presentation: vertex  Pelvis: Not examined Cervix: not evaluated.   Physical Exam  Constitutional: She appears well-developed. No distress.  Cardiovascular: Normal rate.   Respiratory: Breath sounds normal.  GI: Soft.  Musculoskeletal: Normal range of motion.  Neurological: She is alert.  Skin: Skin is warm and dry.  Psychiatric: She has a normal mood and  affect. Her behavior is normal.    Prenatal labs: ABO, Rh: --/--/A POS (02/16 1139) Antibody: NEG (02/16 1139) Rubella: Nonimmune (10/20 1006) RPR: NON REACTIVE (02/16 1138)  HBsAg: Negative (10/20 1006)  HIV: Non-reactive (10/20 1006)  GBS:   negative  Assessment/Plan: Scheduled for repeat cesarean section and BTL. The procedure and the risk of anesthesia, bleeding, infection, bowel and bladder injury, failure (1/200) and ectopic pregnancy were discussed and her questions were answered.    Lori Leon 05/10/2013, 9:35 AM

## 2013-05-10 NOTE — Anesthesia Postprocedure Evaluation (Signed)
  Anesthesia Post-op Note  Patient: Lori Leon  Procedure(s) Performed: Procedure(s): REPEAT CESAREAN SECTION with Bilateral Tubal Ligation (Bilateral)  Patient Location: PACU  Anesthesia Type:Spinal  Level of Consciousness: awake, alert  and oriented  Airway and Oxygen Therapy: Patient Spontanous Breathing  Post-op Pain: mild  Post-op Assessment: Post-op Vital signs reviewed, Patient's Cardiovascular Status Stable, Respiratory Function Stable, Patent Airway, No signs of Nausea or vomiting and Pain level controlled  Post-op Vital Signs: Reviewed and stable  Complications: No apparent anesthesia complications

## 2013-05-10 NOTE — Anesthesia Postprocedure Evaluation (Signed)
Anesthesia Post Note  Patient: Lori Leon  Procedure(s) Performed: Procedure(s) (LRB): REPEAT CESAREAN SECTION with Bilateral Tubal Ligation (Bilateral)  Anesthesia type: Spinal  Patient location: Mother/Baby  Post pain: Pain level controlled  Post assessment: Post-op Vital signs reviewed  Last Vitals:  Filed Vitals:   05/10/13 1520  BP: 103/63  Pulse: 80  Temp: 36.3 C  Resp: 18    Post vital signs: Reviewed  Level of consciousness: awake  Complications: No apparent anesthesia complications

## 2013-05-10 NOTE — Lactation Note (Signed)
This note was copied from the chart of Lori Saintclair Halstedmily Lapaglia. Lactation Consultation Note Initial visit at 10 hours of age.  Mom reports a few feedings, she just attempted and baby is sleepy.  Previous latch scores of "7--8" Baby has voided and stooled.  She sucked a few times and back to sleep.  Discussed early feeding cues and STS. Demonstrated hand expression with colostrum visible.  [facility] given and discussed.  Feeding frequency discussed and referenced "baby and me" booklet.  Mom to call for assist as needed.   Patient Name: Lori Leon WUJWJ'XToday's Date: 05/10/2013 Reason for consult: Initial assessment   Maternal Data Has patient been taught Hand Expression?: Yes Does the patient have breastfeeding experience prior to this delivery?: Yes  Feeding Feeding Type: Breast Fed Length of feed:  (few sucks per mom)  LATCH Score/Interventions                      Lactation Tools Discussed/Used     Consult Status Consult Status: Follow-up Date: 05/11/13 Follow-up type: In-patient    Lori Leon, Lori Leon 05/10/2013, 10:57 PM

## 2013-05-11 ENCOUNTER — Encounter (HOSPITAL_COMMUNITY): Payer: Self-pay | Admitting: Obstetrics & Gynecology

## 2013-05-11 LAB — CBC
HCT: 20.5 % — ABNORMAL LOW (ref 36.0–46.0)
HEMOGLOBIN: 6.8 g/dL — AB (ref 12.0–15.0)
MCH: 28.3 pg (ref 26.0–34.0)
MCHC: 33.2 g/dL (ref 30.0–36.0)
MCV: 85.4 fL (ref 78.0–100.0)
PLATELETS: 134 10*3/uL — AB (ref 150–400)
RBC: 2.4 MIL/uL — AB (ref 3.87–5.11)
RDW: 13.1 % (ref 11.5–15.5)
WBC: 9 10*3/uL (ref 4.0–10.5)

## 2013-05-11 LAB — CCBB MATERNAL DONOR DRAW

## 2013-05-11 NOTE — Lactation Note (Signed)
This note was copied from the chart of Girl Saintclair Halstedmily Tomaszewski. Lactation Consultation Note  Patient Name: Girl Saintclair Halstedmily Esker ZOXWR'UToday's Date: 05/11/2013 Reason for consult: Follow-up assessment Mom reports baby is nursing well, denies questions or concerns. Mom reports she is experienced BF. Advised to call if she would like assist.   Maternal Data    Feeding Feeding Type: Breast Fed Length of feed: 20 min  LATCH Score/Interventions                      Lactation Tools Discussed/Used     Consult Status Consult Status: Follow-up Date: 05/11/13 Follow-up type: In-patient    Alfred LevinsGranger, Nour Scalise Ann 05/11/2013, 5:03 PM

## 2013-05-11 NOTE — Progress Notes (Signed)
I have seen and examined this patient and I agree with the above. Anticipate d/c in AM. No s/s anemia. Cam HaiSHAW, Violett Hobbs 8:41 AM 05/11/2013

## 2013-05-11 NOTE — Progress Notes (Signed)
Subjective: Postpartum Day 1: Cesarean Delivery Patient reports tolerating PO, + flatus and no problems voiding.    Objective: Vital signs in last 24 hours: Temp:  [97.2 F (36.2 C)-98.6 F (37 C)] 97.9 F (36.6 C) (02/19 0600) Pulse Rate:  [66-96] 71 (02/19 0600) Resp:  [16-20] 18 (02/19 0600) BP: (91-119)/(48-70) 100/66 mmHg (02/19 0600) SpO2:  [95 %-100 %] 99 % (02/18 2010) Weight:  [97.523 kg (215 lb)] 97.523 kg (215 lb) (02/18 1900)  Physical Exam:  General: alert, cooperative and appears stated age Lochia: appropriate Uterine Fundus: firm Incision: no significant drainage DVT Evaluation: No evidence of DVT seen on physical exam. Negative Homan's sign. No cords or calf tenderness.   Recent Labs  05/08/13 1140 05/11/13 0627  HGB 8.9* 6.8*  HCT 26.4* 20.5*    Assessment/Plan: Status post Cesarean section. Doing well postoperatively.  Continue current care.  Kevin FentonBradshaw, Kenadie Royce 05/11/2013, 7:39 AM

## 2013-05-11 NOTE — Progress Notes (Signed)
Patient was referred for history of depression/anxiety. * Referral screened out by Clinical Social Worker because none of the following criteria appear to apply:  ~ History of anxiety/depression during this pregnancy, or of post-partum depression.  ~ Diagnosis of anxiety and/or depression within last 3 years  ~ History of depression due to pregnancy loss/loss of child  OR * Patient's symptoms currently being treated with medication and/or therapy.  Please contact the Clinical Social Worker if needs arise, or by the patient's request. Pt's depression symptoms were situational (@ the beginning of pregnancy).  Pt is the single mother of 2 young children & told CSW that pregnancy was not planned.  Her depression symptoms did not require medication or therapy.  She denies any SI or depressed feelings since then.  Pt smiled during assessment & appears to be bonding well with the infant.  PP depression symptoms discussed & pt was encouraged to seek medical attention if needed.  No barriers to discharge at this time.

## 2013-05-12 MED ORDER — OXYCODONE-ACETAMINOPHEN 5-325 MG PO TABS: 1.0000 | ORAL_TABLET | ORAL | Status: DC | PRN

## 2013-05-12 MED ORDER — MEASLES, MUMPS & RUBELLA VAC ~~LOC~~ INJ
0.5000 mL | INJECTION | Freq: Once | SUBCUTANEOUS | Status: AC
  Administered 2013-05-12: 0.5 mL via SUBCUTANEOUS
  Filled 2013-05-12: qty 0.5

## 2013-05-12 MED ORDER — IBUPROFEN 600 MG PO TABS: 600.0000 mg | ORAL_TABLET | Freq: Four times a day (QID) | ORAL | Status: DC

## 2013-05-12 NOTE — Discharge Summary (Signed)
Obstetric Discharge Summary Reason for Admission: cesarean section Prenatal Procedures: none Intrapartum Procedures: cesarean: low cervical, transverse Postpartum Procedures: BTL Complications-Operative and Postpartum: none Hemoglobin  Date Value Ref Range Status  05/11/2013 6.8* 12.0 - 15.0 g/dL Final     REPEATED TO VERIFY     CRITICAL RESULT CALLED TO, READ BACK BY AND VERIFIED WITH:     NELSON,D. @ 0711 ON 05/11/2013 BY BOVELL,A.     HCT  Date Value Ref Range Status  05/11/2013 20.5* 36.0 - 46.0 % Final   Recent Results (from the past 2160 hour(s))  ABO/RH     Status: None   Collection Time    05/08/13 11:35 AM      Result Value Ref Range   ABO/RH(D) A POS    RPR     Status: None   Collection Time    05/08/13 11:38 AM      Result Value Ref Range   RPR NON REACTIVE  NON REACTIVE   Comment: Performed at [facility]  TYPE AND SCREEN     Status: None   Collection Time    05/08/13 11:39 AM      Result Value Ref Range   ABO/RH(D) A POS     Antibody Screen NEG     Sample Expiration 05/11/2013    CBC     Status: Abnormal   Collection Time    05/08/13 11:40 AM      Result Value Ref Range   WBC 7.7  4.0 - 10.5 K/uL   RBC 3.07 (*) 3.87 - 5.11 MIL/uL   Hemoglobin 8.9 (*) 12.0 - 15.0 g/dL   HCT 19.1 (*) 47.8 - 29.5 %   MCV 86.0  78.0 - 100.0 fL   MCH 29.0  26.0 - 34.0 pg   MCHC 33.7  30.0 - 36.0 g/dL   RDW 62.1  30.8 - 65.7 %   Platelets 146 (*) 150 - 400 K/uL  [facility] MATERNAL DONOR DRAW     Status: None   Collection Time    05/11/13  6:27 AM      Result Value Ref Range   [facility] Maternal Donor Draw COLLECTED BY LABORATORY     Comment:  BP  CBC     Status: Abnormal   Collection Time    05/11/13  6:27 AM      Result Value Ref Range   WBC 9.0  4.0 - 10.5 K/uL   RBC 2.40 (*) 3.87 - 5.11 MIL/uL   Hemoglobin 6.8 (*) 12.0 - 15.0 g/dL   Comment: REPEATED TO VERIFY     CRITICAL RESULT CALLED TO, READ BACK BY AND VERIFIED WITH:     NELSON,D. @ 0711 ON 05/11/2013 BY  BOVELL,A.   HCT 20.5 (*) 36.0 - 46.0 %   MCV 85.4  78.0 - 100.0 fL   MCH 28.3  26.0 - 34.0 pg   MCHC 33.2  30.0 - 36.0 g/dL   RDW 84.6  96.2 - 95.2 %   Platelets 134 (*) 150 - 400 K/uL    Cesarean Section Procedure Note  Lori Leon  05/10/2013  Indications: Scheduled Proceedure/Maternal Request  Pre-operative Diagnosis: REPEAT c/section; desires sterilization.  Post-operative Diagnosis: Same  Surgeon: Surgeon(s) and Role:  * Adam Phenix, MD - Primary  Assistants: none  Anesthesia: spinal  Procedure Details:  The patient was seen in the Holding Room. The risks, benefits, complications, treatment options, and expected outcomes were discussed with the patient. The patient concurred with  the proposed plan, giving informed consent. identified as Lori Leon and the procedure verified as C-Section Delivery. A Time Out was held and the above information confirmed.  After induction of anesthesia, the patient was draped and prepped in the usual sterile manner. A transverse was made and carried down through the subcutaneous tissue to the fascia. Fascial incision was made and extended transversely. The fascia was separated from the underlying rectus tissue superiorly and inferiorly. The peritoneum was identified and entered. Peritoneal incision was extended longitudinally. The utero-vesical peritoneal reflection was incised transversely and the bladder flap was bluntly freed from the lower uterine segment. A low transverse uterine incision was made. Delivered from cephalic presentation was a 3415 gram Female with Apgar scores of 9 at one minute and 9 at five minutes. Cord ph was not sent the umbilical cord was clamped and cut cord blood was obtained for evaluation. The placenta was removed Intact and appeared normal. The uterine outline, tubes and ovaries appeared normal}. The uterine incision was closed with running locked sutures of 0coated Vicryl, and imbricating layer followed. Hemostasis was  observed. Both adnexa were inspected and the fallopian tubes were identified and elevated and Filshie clips were applied for sterilization. The peritoneum was closed with a running suture with 2-0 Vicryl. The fascia was then reapproximated with running sutures of 0coated Vicryl. The subcuticular closure was performed using 4-0coated Vicryl. Sterile dressing was applied  Instrument, sponge, and needle counts were correct prior the abdominal closure and were correct at the conclusion of the case.  Findings:  Estimated Blood Loss: * No blood loss amount entered * 700 ml  Total IV Fluids: 1000ml  Urine Output: 100CC OF clear urine  Specimens: none  Complications: no complications  Disposition: PACU - hemodynamically stable.  Maternal Condition: stable  Baby condition / location: Couplet care / Skin to Skin  Attending Attestation: I was present and scrubbed for the entire procedure.  Signed:  Surgeon(s):  Adam PhenixJames G Arnold, MD  05/10/2013    Physical Exam:  General: alert, cooperative and no distress Lochia: appropriate Uterine Fundus: firm Incision: no significant drainage, no significant erythema, no active bleeding DVT Evaluation: No evidence of DVT seen on physical exam. Negative Homan's sign. No significant calf/ankle edema.   Hospital Course:  Lori Leon is a 28 y.o. [phone] who presented for RLTCS + BTL at 39 weeks. The procedure went well and she delivered a viable female. A postpartum BTL was also performed and was uneventful. Postpartum care was uncomplicated and she is being discharged in stable condition POD#2. Her Hgb this morning was 6.8 (vs 8.9 on 05/08/13, preop) but she is asymptomatic with a prior history of anemia. She is currently breast feeding. SW consult for h/o depression/anxiety on 05/11/2013. No barriers to discharge at this time.     Discharge Diagnoses: Term Pregnancy-delivered  Discharge Information: Date: 05/12/2013 Activity: pelvic rest Diet:  routine Medications: PNV, Ibuprofen and Iron Condition: stable Instructions: refer to practice specific booklet Discharge to: home  Follow-up Information   [facility] [place]. Schedule an appointment as soon as possible for a visit in 4 weeks. (Postpartum follow up.)    Contact information:   [address] [place] [id number] [phone]      Newborn Data: Live born female  Birth Weight: 7 lb 8.5 oz (3415 g) APGAR: 9, 9  Home with mother.  [place], MontroseBennie-John, T PA-S 05/12/2013, 7:31 AM  I have seen and examined this patient  and agree the above assessment. CRESENZO-DISHMAN,Debbera Wolken 05/12/2013 3:52 PM

## 2013-05-12 NOTE — Discharge Instructions (Signed)
Cesarean Delivery °Care After °Refer to this sheet in the next few weeks. These instructions provide you with information on caring for yourself after your procedure. Your health care provider may also give you specific instructions. Your treatment has been planned according to current medical practices, but problems sometimes occur. Call your health care provider if you have any problems or questions after you go home. °HOME CARE INSTRUCTIONS  °· Only take over-the-counter or prescription medications as directed by your health care provider. °· Do not drink alcohol, especially if you are breastfeeding or taking medication to relieve pain. °· Do not chew or smoke tobacco. °· Continue to use good perineal care. Good perineal care includes: °· Wiping your perineum from front to back. °· Keeping your perineum clean. °· Check your surgical cut (incision) daily for increased redness, drainage, swelling, or separation of skin. °· Clean your incision gently with soap and water every day, and then pat it dry. If your health care provider says it is OK, leave the incision uncovered. Use a bandage (dressing) if the incision is draining fluid or appears irritated. If the adhesive strips across the incision do not fall off within 7 days, carefully peel them off. °· Hug a pillow when coughing or sneezing until your incision is healed. This helps to relieve pain. °· Do not use tampons or douche until your health care provider says it is okay. °· Shower, wash your hair, and take tub baths as directed by your health care provider. °· Wear a well-fitting bra that provides breast support. °· Limit wearing support panties or control-top hose. °· Drink enough fluids to keep your urine clear or pale yellow. °· Eat high-fiber foods such as whole grain cereals and breads, brown rice, beans, and fresh fruits and vegetables every day. These foods may help prevent or relieve constipation. °· Resume activities such as climbing stairs,  driving, lifting, exercising, or traveling as directed by your health care provider. °· Talk to your health care provider about resuming sexual activities. This is dependent upon your risk of infection, your rate of healing, and your comfort and desire to resume sexual activity. °· Try to have someone help you with your household activities and your newborn for at least a few days after you leave the hospital. °· Rest as much as possible. Try to rest or take a nap when your newborn is sleeping. °· Increase your activities gradually. °· Keep all of your scheduled postpartum appointments. It is very important to keep your scheduled follow-up appointments. At these appointments, your health care provider will be checking to make sure that you are healing physically and emotionally. °SEEK MEDICAL CARE IF:  °· You are passing large clots from your vagina. Save any clots to show your health care provider. °· You have a foul smelling discharge from your vagina. °· You have trouble urinating. °· You are urinating frequently. °· You have pain when you urinate. °· You have a change in your bowel movements. °· You have increasing redness, pain, or swelling near your incision. °· You have pus draining from your incision. °· Your incision is separating. °· You have painful, hard, or reddened breasts. °· You have a severe headache. °· You have blurred vision or see spots. °· You feel sad or depressed. °· You have thoughts of hurting yourself or your newborn. °· You have questions about your care, the care of your newborn, or medications. °· You are dizzy or lightheaded. °· You have a rash. °· You   have pain, redness, or swelling at the site of the removed intravenous access (IV) tube. °· You have nausea or vomiting. °· You stopped breastfeeding and have not had a menstrual period within 12 weeks of stopping. °· You are not breastfeeding and have not had a menstrual period within 12 weeks of delivery. °· You have a fever. °SEEK  IMMEDIATE MEDICAL CARE IF: °· You have persistent pain. °· You have chest pain. °· You have shortness of breath. °· You faint. °· You have leg pain. °· You have stomach pain. °· Your vaginal bleeding saturates 2 or more sanitary pads in 1 hour. °MAKE SURE YOU:  °· Understand these instructions. °· Will watch your condition. °· Will get help right away if you are not doing well or get worse. °Document Released: 11/29/2001 Document Revised: 11/09/2012 Document Reviewed: 11/04/2011 °[employer]® Patient Information ©2014 [employer]. ° ° ° °

## 2013-05-14 NOTE — Discharge Summary (Signed)
Attestation of Attending Supervision of Advanced Practitioner (PA/CNM/NP): Evaluation and management procedures were performed by the Advanced Practitioner under my supervision and collaboration.  I have reviewed the Advanced Practitioner's note and chart, and I agree with the management and plan.  Doyle Tegethoff, MD, FACOG Attending Obstetrician & Gynecologist Faculty Practice, [facility]  

## 2014-01-22 ENCOUNTER — Encounter (HOSPITAL_COMMUNITY): Payer: Self-pay | Admitting: Obstetrics & Gynecology

## 2014-03-26 ENCOUNTER — Encounter: Payer: Self-pay | Admitting: *Deleted

## 2014-04-03 ENCOUNTER — Emergency Department (HOSPITAL_COMMUNITY)
Admission: EM | Admit: 2014-04-03 | Discharge: 2014-04-03 | Disposition: A | Discharge status: Home / Self Care | Payer: Medicare Other | Attending: Emergency Medicine | Admitting: Emergency Medicine

## 2014-04-03 ENCOUNTER — Encounter (HOSPITAL_COMMUNITY): Payer: Self-pay

## 2014-04-03 ENCOUNTER — Emergency Department (HOSPITAL_COMMUNITY): Payer: Medicare Other

## 2014-04-03 DIAGNOSIS — M25561 Pain in right knee: Secondary | ICD-10-CM | POA: Diagnosis present

## 2014-04-03 DIAGNOSIS — Z79899 Other long term (current) drug therapy: Secondary | ICD-10-CM | POA: Insufficient documentation

## 2014-04-03 DIAGNOSIS — D649 Anemia, unspecified: Secondary | ICD-10-CM | POA: Insufficient documentation

## 2014-04-03 DIAGNOSIS — Z3202 Encounter for pregnancy test, result negative: Secondary | ICD-10-CM | POA: Insufficient documentation

## 2014-04-03 DIAGNOSIS — M1990 Unspecified osteoarthritis, unspecified site: Secondary | ICD-10-CM

## 2014-04-03 DIAGNOSIS — Z8719 Personal history of other diseases of the digestive system: Secondary | ICD-10-CM | POA: Insufficient documentation

## 2014-04-03 DIAGNOSIS — M179 Osteoarthritis of knee, unspecified: Secondary | ICD-10-CM | POA: Diagnosis not present

## 2014-04-03 DIAGNOSIS — Z791 Long term (current) use of non-steroidal anti-inflammatories (NSAID): Secondary | ICD-10-CM | POA: Diagnosis not present

## 2014-04-03 LAB — POC URINE PREG, ED: Preg Test, Ur: NEGATIVE

## 2014-04-03 NOTE — Discharge Instructions (Signed)
Please call your doctor for a followup appointment within 24-48 hours. When you talk to your doctor please let them know that you were seen in the emergency department and have them acquire all of your records so that they can discuss the findings with you and formulate a treatment plan to fully care for your new and ongoing problems. Please call and set up an appointment with health and wellness Center Please follow-up with orthopedics, Dr. Carola FrostHandy, regarding right knee pain Please keep knee sleeve on at all times for comfort purposes Please rest, ice, elevate-toes above nose-after a long day Please massage with icy hot ointment Please continue to monitor symptoms closely and if symptoms are to worsen or change (fever greater than 101, chills, sweating, nausea, vomiting, chest pain, shortness of breathe, difficulty breathing, weakness, numbness, tingling, worsening or changes to pain pattern, fall, injury, swelling, hot to the touch, Lori Leon's) please report back to the Emergency Department immediately.  Arthritis, Nonspecific Arthritis is inflammation of a joint. This usually means pain, redness, warmth or swelling are present. One or more joints may be involved. There are a number of types of arthritis. Your caregiver may not be able to tell what type of arthritis you have right away. CAUSES  The most common cause of arthritis is the wear and tear on the joint (osteoarthritis). This causes damage to the cartilage, which can break down over time. The knees, hips, back and neck are most often affected by this type of arthritis. Other types of arthritis and common causes of joint pain include:  Sprains and other injuries near the joint. Sometimes minor sprains and injuries cause pain and swelling that develop hours later.  Rheumatoid arthritis. This affects hands, feet and knees. It usually affects both sides of your body at the same time. It is often associated with chronic ailments, fever,  weight loss and general weakness.  Crystal arthritis. Gout and pseudo gout can cause occasional acute severe pain, redness and swelling in the foot, ankle, or knee.  Infectious arthritis. Bacteria can get into a joint through a break in overlying skin. This can cause infection of the joint. Bacteria and viruses can also spread through the blood and affect your joints.  Drug, infectious and allergy reactions. Sometimes joints can become mildly painful and slightly swollen with these types of illnesses. SYMPTOMS   Pain is the main symptom.  Your joint or joints can also be red, swollen and warm or hot to the touch.  You may have a fever with certain types of arthritis, or even feel overall ill.  The joint with arthritis will hurt with movement. Stiffness is present with some types of arthritis. DIAGNOSIS  Your caregiver will suspect arthritis based on your description of your symptoms and on your exam. Testing may be needed to find the type of arthritis:  Blood and sometimes urine tests.  X-ray tests and sometimes CT or MRI scans.  Removal of fluid from the joint (arthrocentesis) is done to check for bacteria, crystals or other causes. Your caregiver (or a specialist) will numb the area over the joint with a local anesthetic, and use a needle to remove joint fluid for examination. This procedure is only minimally uncomfortable.  Even with these tests, your caregiver may not be able to tell what kind of arthritis you have. Consultation with a specialist (rheumatologist) may be helpful. TREATMENT  Your caregiver will discuss with you treatment specific to your type of arthritis. If the specific type cannot be  determined, then the following general recommendations may apply. Treatment of severe joint pain includes:  Rest.  Elevation.  Anti-inflammatory medication (for example, ibuprofen) may be prescribed. Avoiding activities that cause increased pain.  Only take over-the-counter or  prescription medicines for pain and discomfort as recommended by your caregiver.  Cold packs over an inflamed joint may be used for 10 to 15 minutes every hour. Hot packs sometimes feel better, but do not use overnight. Do not use hot packs if you are diabetic without your caregiver's permission.  A cortisone shot into arthritic joints may help reduce pain and swelling.  Any acute arthritis that gets worse over the next 1 to 2 days needs to be looked at to be sure there is no joint infection. Long-term arthritis treatment involves modifying activities and lifestyle to reduce joint stress jarring. This can include weight loss. Also, exercise is needed to nourish the joint cartilage and remove waste. This helps keep the muscles around the joint strong. HOME CARE INSTRUCTIONS   Do not take aspirin to relieve pain if gout is suspected. This elevates uric acid levels.  Only take over-the-counter or prescription medicines for pain, discomfort or fever as directed by your caregiver.  Rest the joint as much as possible.  If your joint is swollen, keep it elevated.  Use crutches if the painful joint is in your leg.  Drinking plenty of fluids may help for certain types of arthritis.  Follow your caregiver's dietary instructions.  Try low-impact exercise such as:  Swimming.  Water aerobics.  Biking.  Walking.  Morning stiffness is often relieved by a warm shower.  Put your joints through regular range-of-motion. SEEK MEDICAL CARE IF:   You do not feel better in 24 hours or are getting worse.  You have side effects to medications, or are not getting better with treatment. SEEK IMMEDIATE MEDICAL CARE IF:   You have a fever.  You develop severe joint pain, swelling or redness.  Many joints are involved and become painful and swollen.  There is severe back pain and/or leg weakness.  You have loss of bowel or bladder control. Document Released: 04/16/2004 Document Revised:  06/01/2011 Document Reviewed: 05/02/2008 [facility] Patient Information 2015 [place], [place]. This information is not intended to replace advice given to you by your health care provider. Make sure you discuss any questions you have with your health care provider.    Arthralgia Arthralgia is joint pain. A joint is a place where two bones meet. Joint pain can happen for many reasons. The joint can be bruised, stiff, infected, or weak from aging. Pain usually goes away after resting and taking medicine for soreness.  HOME CARE  Rest the joint as told by your doctor.  Keep the sore joint raised (elevated) for the first 24 hours.  Put ice on the joint area.  Put ice in a plastic bag.  Place a towel between your skin and the bag.  Leave the ice on for 15-20 minutes, 03-04 times a day.  Wear your splint, casting, elastic bandage, or sling as told by your doctor.  Only take medicine as told by your doctor. Do not take aspirin.  Use crutches as told by your doctor. Do not put weight on the joint until told to by your doctor. GET HELP RIGHT AWAY IF:   You have bruising, puffiness (swelling), or more pain.  Your fingers or toes turn blue or start to lose feeling (numb).  Your medicine does not lessen the pain.  Your pain becomes severe.  You have a temperature by mouth above 102 F (38.9 C), not controlled by medicine.  You cannot move or use the joint. MAKE SURE YOU:   Understand these instructions.  Will watch your condition.  Will get help right away if you are not doing well or get worse. Document Released: 02/25/2009 Document Revised: 06/01/2011 Document Reviewed: 02/25/2009 [facility] Patient Information 2015 [place], [place]. This information is not intended to replace advice given to you by your health care provider. Make sure you discuss any questions you have with your health care provider.   Emergency Department Resource Guide 1) Find a Doctor and Pay Out of  Pocket Although you won't have to find out who is covered by your insurance plan, it is a good idea to ask around and get recommendations. You will then need to call the office and see if the doctor you have chosen will accept you as a new patient and what types of options they offer for patients who are self-pay. Some doctors offer discounts or will set up payment plans for their patients who do not have insurance, but you will need to ask so you aren't surprised when you get to your appointment.  2) Contact Your Local Health Department Not all health departments have doctors that can see patients for sick visits, but many do, so it is worth a call to see if yours does. If you don't know where your local health department is, you can check in your phone book. The CDC also has a tool to help you locate your state's health department, and many state websites also have listings of all of their local health departments.  3) Find a Walk-in Clinic If your illness is not likely to be very severe or complicated, you may want to try a walk in clinic. These are popping up all over the country in pharmacies, drugstores, and shopping centers. They're usually staffed by nurse practitioners or physician assistants that have been trained to treat common illnesses and complaints. They're usually fairly quick and inexpensive. However, if you have serious medical issues or chronic medical problems, these are probably not your best option.  No Primary Care Doctor: - Call Health Connect at  [phone] - they can help you locate a primary care doctor that  accepts your insurance, provides certain services, etc. - Physician Referral Service- [phone]  Chronic Pain Problems: Organization         Address  Phone   Notes  [facility]  [phone] Patients need to be referred by their primary care doctor.   Medication Assistance: Organization         Address  Phone   Notes  [facility]  Medication [facility] [address] [place] [place], [place] [address] [phone] --Must be a resident of [place] -- Must have NO insurance coverage whatsoever (no Medicaid/ Medicare, etc.) -- The pt. MUST have a primary care doctor that directs their care regularly and follows them in the community   [facility]  [phone]   [employer]  [phone]    Agencies that provide inexpensive medical care: Organization         Address  Phone   Notes  [facility]  [phone]   [facility]    [phone]   [facility] [address] [place], [place] [address] [phone]   [facility]  Lovenia Shuck, Rodriguez Hevia [phone]   Planned Parenthood    [phone]   [facility]    [phone]   [facility]  [address], [place] Phone:  [phone], Fax:  [phone] Hours of Operation:  9 am - 6 pm, M-F.  Also accepts Medicaid/Medicare and self-pay.  [facility]  [address], [place] Phone: [phone], Fax: [phone]. Hours of Operation:  8:30 am - 5:30 pm, M-F.  Also accepts Medicaid and self-pay.  [facility] [place] [address] [place] Phone: [phone]   [facility] [address] Natasha Bence [place], [place] [phone], Ext. [phone] Mondays & Thursdays: 7-9 AM.  First 15 patients are seen on a first come, first serve basis.    Medicaid-accepting [facility] Providers:  Organization         Address  Phone   Notes  [facility] [address], [place] [phone] Also accepts self-pay patients.  [facility] [address] Laurell Josephs [place], [place]  [phone]   [facility] [address], [place] [phone]   [facility] [address], [place] [phone]   Renaye Rakers [address], [place]   [phone] Only accepts [place] Access [place] patients after they have their name applied to their card.   Self-Pay (no insurance) in [facility]:  Organization         Address  Phone   Notes  Sickle Cell Patients, [facility] [address] [place], [place] [phone]   [facility] [address] [place], [place] [phone]   [facility] [place]  [address], [place] [phone]   [facility]/Dr. Osei-Bonsu  [address], [place] or [address], [place] [phone] Phone number for both [place] and [place] locations is the same.  [facility] [address] [place] [phone]   [facility] [address] [place] or [address] [phone]   [facility] [address], [place] [phone], phone; [phone], fax Sees patients 1st and 3rd Saturday of every month.  Must not qualify for public or private insurance (i.e. Medicaid, Medicare, [place] Health Choice, Veterans' Benefits)  Household income should be no more than 200% of the poverty level The clinic cannot treat you if you are pregnant or think you are pregnant  Sexually transmitted diseases are not treated at the clinic.    Dental Care: Organization         Address  Phone  Notes  [facility] Department of [facility] [address] [place], [place] [phone] Accepts children up to age 76 who are enrolled in [place] or [place] Health Choice; pregnant women with a Medicaid card; and children who have applied for Medicaid or [place] Health Choice, but were declined, whose parents can pay a reduced fee at time of service.  [facility]  [address], [place]  [phone] Accepts children up to age 94 who are enrolled in [place] or [place] Health Choice; pregnant women with a Medicaid card; and children who have applied for  Medicaid or [place] Health Choice, but were declined, whose parents can pay a reduced fee at time of service.  [facility]  [address] [place] [address] [place], [place] [phone] Patients are seen by appointment only. Walk-ins are not accepted. [facility] will see patients 17 years of age and older. Monday - Tuesday (8am-5pm) Most Wednesdays (8:30-5pm) $30 per visit, cash only  [facility]  [address], [facility] [phone] Patients are seen by appointment only. Walk-ins are not accepted. [facility] will see patients 34 years of age and older. One Wednesday Evening (Monthly: Volunteer Based).  $30 per visit, cash only  [employer]  [phone] for adults; Children under age 45, call Graduate Pediatric Dentistry at [phone]. Children aged 10-14, please call [phone] to request a pediatric application.  Dental services are provided in all areas of dental care including fillings, crowns and bridges, complete and partial dentures, implants, gum treatment, root canals, and extractions. Preventive care is also provided. Treatment is provided to both adults and children. Patients are selected via a lottery and there is often a waiting list.   [facility] [address], [place]  [phone] [email or web address]   [facility] [address] [place], [place] [phone], Ext. [phone] Second and Fourth Thursday of each month, opens at 6:30 AM; Clinic ends at 9 AM.  Patients are seen on a first-come first-served basis, and a limited number are seen during each clinic.   [facility]  [address] Ether Griffins [place], [place] [phone]   Eligibility Requirements You must have lived in [place], [place], or [place]  counties for at least the last three months.   You cannot be eligible for state or federal sponsored [place], including [employer], [place], or [employer].   You generally cannot be eligible for healthcare insurance through your employer.    How to apply: Eligibility screenings are held every Tuesday and Wednesday afternoon from 1:00 pm until 4:00 pm. You do not need an appointment for the interview!  [facility] [address] [place], [place] [phone]   [facility] Health Department  [phone]   [facility] Department  [phone]   [facility] Department  [phone]    Behavioral Health Resources in the Community: Intensive Outpatient Programs Organization         Address  Phone  Notes  [facility] [address] [place], [place] [phone]   [facility] Outpatient [address] [place], [place] [phone]   [facility] [address] [place], [place]  [phone]   [facility] [address]  [place], [place] [phone] or [phone]   Substance Abuse Resources Organization         Address  Phone  Notes  [facility]  [phone]   [facility]  [phone]   [facility]  [phone]   Floydene Flock  [phone]   Residential & Outpatient Substance Abuse Program  [phone]   Psychological Services Organization         Address  Phone  Notes  [facility] Behavioral Health  [phone]   [facility]  [phone]   [facility] [address], [place] [phone] or [phone]    Mobile Crisis Teams Organization         Address  Phone  Notes  [facility], Mobile Crisis Care Unit  [phone]   [facility]  [address] [place], [place] [phone]   [facility] [place] [facility] [address] [place]  [place] [phone]    Self-Help/Support Groups Organization         Address  Phone             Notes  [facility] [place] - variety of support groups  336- I7437963 Call for more information  Narcotics Anonymous (NA), [facility] [address], [employer] [place]  2 meetings at this location   Statistician         Address  Phone  Notes  [facility] [address],    [place]  [phone]   [facility]  [address] [place] [address], [place], [place] [phone]   [facility] [address] [place], [place] [phone] Admissions: 8am-3pm M-F  [facility] [address],    [place], [place] [phone]   [facility] [address] [place], [place], [place] [phone]   [facility] [address],  [place], [place] [phone]   [facility] - Intensive Outpatient [address] Laurell Josephs [address], [place], [place] [phone]   [facility] ([facility] [address] [place]  [place], [place] [phone] or [phone]   [facility] ([facility]) [address], [place], [place] [phone] Accepts Medicaid  [facility] [address]  [place] [phone] Substance Abuse/Addiction Treatment   [facility] Organization         Address  Phone  Notes  [facility]  (260) [phone]   Angie Fava, PhD [address] Ervin Knack [place], [place]   [phone] or [phone]   [facility]al   [address] [place], [place] ([phone]   [facility] [address] [place], [place] [phone] Insurance/Medicaid/sponsorship through [facility] and Families [address]                                    [place], [place] [phone] Therapy/tele-psych/case  [facility] [address], [place] [phone]    Dr. Lolly Mustache  [phone]   [facility]  [employer] [facility] Dept. 1) [address], [place] 2) [address] [place] 3)  [address], [place] [phone]  [phone]   [facility] Child Abuse Hotline 970-886-5699 or [phone] [phone] (After Hours)

## 2014-04-03 NOTE — ED Notes (Signed)
Rt knee pain for 2 months, denies injuries.

## 2014-04-03 NOTE — ED Provider Notes (Signed)
CSN: [id number]     Arrival date & time 04/03/14  1001 History  This chart was scribed for non-physician practitioner, Vinetta BergamoMarissa Felisia Balcom PA-C, working with Nelia Shiobert L Beaton, MD by Milly JakobJohn Lee Graves, ED Scribe. The patient was seen in room TR07C/TR07C. Patient's care was started at 11:18 AM.   Chief Complaint  Patient presents with  . Knee Pain   The history is provided by the patient. No language interpreter was used.   HPI Comments: Lori Leon is a 29 y.o. female with no known significant past medical history who presents to the Emergency Department complaining of non-radiating, right knee pain which began 2 months ago. She reports associated mild swelling. She states that the pain is exacerbated by movement and activity, and alleviated by rest. She denies taking any medications for this pain. She reports using a cold compress with minimal relief. She denies numbness, weakness, loss of sensation, hot to touch, fever, chills, or red streaks. She denies history of fall or injury.     PCP: none  Past Medical History  Diagnosis Date  . GERD (gastroesophageal reflux disease)   . Anemia    Past Surgical History  Procedure Laterality Date  . Cesarean section    . Cesarean section Bilateral 05/10/2013    Procedure: REPEAT CESAREAN SECTION with Bilateral Tubal Ligation;  Surgeon: Adam PhenixJames G Arnold, MD;  Location: [facility];  Service: Obstetrics;  Laterality: Bilateral;   History reviewed. No pertinent family history. History  Substance Use Topics  . Smoking status: Never Smoker   . Smokeless tobacco: Not on file  . Alcohol Use: No   OB History    Gravida Para Term Preterm AB TAB SAB Ectopic Multiple Living   3 3 3  0 0 0 0 0 0 3     Review of Systems  Constitutional: Negative for fever and chills.  Musculoskeletal: Positive for arthralgias (right knee). Negative for joint swelling and gait problem.   Allergies  Review of patient's allergies indicates no known allergies.  Home Medications    Prior to Admission medications   Medication Sig Start Date End Date Taking? Authorizing Provider  ibuprofen (ADVIL,MOTRIN) 600 MG tablet Take 1 tablet (600 mg total) by mouth every 6 (six) hours. 05/12/13   Jacklyn ShellFrances Cresenzo-Dishmon, CNM  IRON PO Take 1 tablet by mouth 3 (three) times a week.    Historical Provider, MD  oxyCODONE-acetaminophen (PERCOCET/ROXICET) 5-325 MG per tablet Take 1-2 tablets by mouth every 4 (four) hours as needed for severe pain (moderate - severe pain). 05/12/13   Jacklyn ShellFrances Cresenzo-Dishmon, CNM  Prenatal Vit-Fe Fumarate-FA (MULTIVITAMIN-PRENATAL) 27-0.8 MG TABS tablet Take 1 tablet by mouth daily at 12 noon.    Historical Provider, MD   Triage Vitals: BP 134/85 mmHg  Pulse 69  Temp(Src) 98.1 F (36.7 C) (Oral)  Resp 16  SpO2 100%  LMP 02/20/2014  Breastfeeding? Yes Physical Exam  Constitutional: She is oriented to person, place, and time. She appears well-developed and well-nourished. No distress.  HENT:  Head: Normocephalic and atraumatic.  Eyes: Conjunctivae and EOM are normal. Right eye exhibits no discharge. Left eye exhibits no discharge.  Neck: Normal range of motion. Neck supple.  Cardiovascular: Normal rate, regular rhythm and normal heart sounds.  Exam reveals no friction rub.   No murmur heard. Pulses:      Radial pulses are 2+ on the right side, and 2+ on the left side.       Dorsalis pedis pulses are 2+ on the right side,  and 2+ on the left side.  Pulmonary/Chest: Effort normal and breath sounds normal. No respiratory distress. She has no wheezes. She has no rales.  Musculoskeletal: Normal range of motion. She exhibits no edema or tenderness.  Negative swelling, erythema, inflammation, lesions, sores, deformities, malalignment identified to the right knee. Negative warmth upon palpation. Negative tenderness upon palpation. Negative pain upon palpation to the right calf. Full range of motion to the right hip, right knee, right ankle without difficulty  or ataxia. Full flexion extension of the right knee without difficulty.  Neurological: She is alert and oriented to person, place, and time. No cranial nerve deficit. She exhibits normal muscle tone. Coordination normal.  Cranial nerves III-XII grossly intact Strength 5+/5+ to lower extremities bilaterally with resistance applied, equal distribution noted Sensation intact with differentiation sharp and dull touch Negative saddle paresthesias bilaterally Gait proper, proper balance - negative sway, negative drift, negative step-offs  Skin: Skin is warm and dry. No rash noted. She is not diaphoretic. No erythema.  Psychiatric: She has a normal mood and affect. Her behavior is normal. Thought content normal.  Nursing note and vitals reviewed.   ED Course  Procedures (including critical care time) DIAGNOSTIC STUDIES: Oxygen Saturation is 100% on room air, normal by my interpretation.    COORDINATION OF CARE: 11:24 AM-Discussed treatment plan which includes ice and elevation at home, and a knee sleeve with pt at bedside and pt agreed to plan.   Results for orders placed or performed during the hospital encounter of 04/03/14  POC urine preg, ED (not at [facility])  Result Value Ref Range   Preg Test, Ur NEGATIVE NEGATIVE    Labs Review Labs Reviewed  POC URINE PREG, ED    Imaging Review Dg Knee Complete 4 Views Right  04/03/2014   CLINICAL DATA:  Generalized right knee pain  EXAM: RIGHT KNEE - COMPLETE 4+ VIEW  COMPARISON:  None.  FINDINGS: The right knee demonstrates no acute fracture or dislocation. There is no significant joint effusion.  IMPRESSION: No acute osseous injury of the right knee.   Electronically Signed   By: Elige Ko   On: 04/03/2014 10:50     EKG Interpretation None      MDM   Final diagnoses:  Knee pain, right  Arthritis    Medications - No data to display  Filed Vitals:   04/03/14 1006  BP: 134/85  Pulse: 69  Temp: 98.1 F (36.7 C)  TempSrc: Oral   Resp: 16  SpO2: 100%    I personally performed the services described in this documentation, which was scribed in my presence. The recorded information has been reviewed and is accurate.  Urine pregnancy negative. Lane film of right knee negative for acute osseous injury. Doubt septic joint. Doubt compartment syndrome. Negative signs of ischemia. Suspicion of right knee pain for the past 2 months to be arthritis. Negative focal neurological deficits noted. Pulses palpable and strong. Full range of motion without difficulty or ataxia. Gait proper-negative step-offs or sway. Patient placed in knee sleeve for comfort. Patient stable, afebrile. Patient not septic appearing. Discharged patient. [facility] and [facility]opedics. Discussed with patient to rest, ice, elevate. Discussed with patient to closely monitor symptoms and if symptoms are to worsen or change to report back to the ED - strict return instructions given.  Patient agreed to plan of care, understood, all questions answered.   Raymon Mutton, PA-C 04/03/14 1139  Nelia Shi, MD 04/06/14 [phone]

## 2015-03-21 ENCOUNTER — Encounter (HOSPITAL_COMMUNITY): Payer: Self-pay | Admitting: *Deleted

## 2015-03-21 ENCOUNTER — Emergency Department (HOSPITAL_COMMUNITY)
Admission: EM | Admit: 2015-03-21 | Discharge: 2015-03-21 | Disposition: A | Discharge status: Home / Self Care | Payer: Medicare Other | Attending: Emergency Medicine | Admitting: Emergency Medicine

## 2015-03-21 DIAGNOSIS — Z791 Long term (current) use of non-steroidal anti-inflammatories (NSAID): Secondary | ICD-10-CM | POA: Insufficient documentation

## 2015-03-21 DIAGNOSIS — Z79899 Other long term (current) drug therapy: Secondary | ICD-10-CM | POA: Diagnosis not present

## 2015-03-21 DIAGNOSIS — Z8719 Personal history of other diseases of the digestive system: Secondary | ICD-10-CM | POA: Insufficient documentation

## 2015-03-21 DIAGNOSIS — D649 Anemia, unspecified: Secondary | ICD-10-CM | POA: Insufficient documentation

## 2015-03-21 DIAGNOSIS — H9202 Otalgia, left ear: Secondary | ICD-10-CM | POA: Diagnosis present

## 2015-03-21 DIAGNOSIS — J039 Acute tonsillitis, unspecified: Secondary | ICD-10-CM | POA: Diagnosis not present

## 2015-03-21 MED ORDER — PENICILLIN V POTASSIUM 500 MG PO TABS: 500.0000 mg | ORAL_TABLET | Freq: Three times a day (TID) | ORAL | Status: DC

## 2015-03-21 NOTE — ED Notes (Signed)
Pt reports sore throat and left ear pain for several days, denies fever.

## 2015-03-21 NOTE — Discharge Instructions (Signed)

## 2015-03-21 NOTE — ED Provider Notes (Signed)
CSN: [id number]     Arrival date & time 03/21/15  1201 History  By signing my name below, I, [name], attest that this documentation has been prepared under the direction and in the presence of Marlon Pel, PA-C Electronically Signed: Charline Bills, ED Scribe 03/21/2015 at 1:16 PM.   Chief Complaint  Patient presents with  . Otalgia   The history is provided by the patient. No language interpreter was used.   HPI Comments: Lori Leon is a 29 y.o. female who presents to the Emergency Department complaining of constant left ear pain onset 3 days ago. Pt describes ear pain as a constant, burning sensation. She reports associated sore throat for the past 3 days that is exacerbated with swallowing. No difficulty swallowing.The pain is bilateral. Pt has tried Tylenol Cold and Flu without significant relief. She denies fever, cough, nausea, vomiting, diarrhea, abdominal pain. No known medical allergies.   Past Medical History  Diagnosis Date  . GERD (gastroesophageal reflux disease)   . Anemia    Past Surgical History  Procedure Laterality Date  . Cesarean section    . Cesarean section Bilateral 05/10/2013    Procedure: REPEAT CESAREAN SECTION with Bilateral Tubal Ligation;  Surgeon: Adam Phenix, MD;  Location: [facility];  Service: Obstetrics;  Laterality: Bilateral;   History reviewed. No pertinent family history. Social History  Substance Use Topics  . Smoking status: Never Smoker   . Smokeless tobacco: None  . Alcohol Use: No   OB History    Gravida Para Term Preterm AB TAB SAB Ectopic Multiple Living   0 0 0 0 0 0 3     Review of Systems  Constitutional: Negative for fever.  HENT: Positive for ear pain and sore throat.   Respiratory: Negative for cough.   Gastrointestinal: Negative for nausea, vomiting, abdominal pain and diarrhea.  All other systems reviewed and are negative.  Allergies  Review of patient's allergies indicates no known allergies.  Home  Medications   Prior to Admission medications   Medication Sig Start Date End Date Taking? Authorizing Provider  ibuprofen (ADVIL,MOTRIN) 600 MG tablet Take 1 tablet (600 mg total) by mouth every 6 (six) hours. 05/12/13   Jacklyn Shell, CNM  IRON PO Take 1 tablet by mouth 3 (three) times a week.    Historical Provider, MD  oxyCODONE-acetaminophen (PERCOCET/ROXICET) 5-325 MG per tablet Take 1-2 tablets by mouth every 4 (four) hours as needed for severe pain (moderate - severe pain). 05/12/13   Jacklyn Shell, CNM  penicillin v potassium (VEETID) 500 MG tablet Take 1 tablet (500 mg total) by mouth 3 (three) times daily. 03/21/15   Marlon Pel, PA-C  Prenatal Vit-Fe Fumarate-FA (MULTIVITAMIN-PRENATAL) 27-0.8 MG TABS tablet Take 1 tablet by mouth daily at 12 noon.    Historical Provider, MD   BP 119/79 mmHg  Pulse 99  Temp(Src) 98.9 F (37.2 C) (Oral)  Resp 18  SpO2 99% Physical Exam  Constitutional: She is oriented to person, place, and time. She appears well-developed and well-nourished. No distress.  HENT:  Head: Normocephalic and atraumatic.  Right Ear: Tympanic membrane, external ear and ear canal normal.  Left Ear: Tympanic membrane, external ear and ear canal normal.  Nose: Nose normal. No rhinorrhea. Right sinus exhibits no maxillary sinus tenderness and no frontal sinus tenderness. Left sinus exhibits no maxillary sinus tenderness and no frontal sinus tenderness.  Mouth/Throat: Uvula is midline and mucous membranes are normal. No trismus in the jaw.  Normal dentition. No dental abscesses or uvula swelling. Posterior oropharyngeal edema present. No oropharyngeal exudate, posterior oropharyngeal erythema or tonsillar abscesses.  No submental edema, tongue not elevated, no trismus. No impending airway obstruction; Pt able to speak full sentences, swallow intact, no drooling, stridor, or tonsillar/uvula displacement. No palatal petechia  Eyes: Conjunctivae and EOM are  normal.  Neck: Trachea normal, normal range of motion and full passive range of motion without pain. Neck supple. No rigidity. No tracheal deviation and normal range of motion present. No Brudzinski's sign noted.  Flexion and extension of neck without pain or difficulty. Able to breath without difficulty in extension.  Cardiovascular: Normal rate and regular rhythm.   Pulmonary/Chest: Effort normal and breath sounds normal. No stridor. No respiratory distress. She has no wheezes.  Abdominal: Soft. There is no tenderness.  No obvious evidence of splenomegaly. Non ttp.   Musculoskeletal: Normal range of motion.  Lymphadenopathy:       Head (right side): No preauricular and no posterior auricular adenopathy present.       Head (left side): No preauricular and no posterior auricular adenopathy present.    She has no cervical adenopathy.  Neurological: She is alert and oriented to person, place, and time.  Skin: Skin is warm and dry. No rash noted. She is not diaphoretic.  Psychiatric: She has a normal mood and affect. Her behavior is normal.  Nursing note and vitals reviewed.  ED Course  Procedures (including critical care time) DIAGNOSTIC STUDIES: Oxygen Saturation is 99% on RA, normal by my interpretation.    COORDINATION OF CARE: 1:13 PM-Discussed treatment plan which includes Veetid with pt at bedside and pt agreed to plan.   Labs Review Labs Reviewed - No data to display  Imaging Review No results found. I have personally reviewed and evaluated these images and lab results as part of my medical decision-making.   EKG Interpretation None      MDM   Final diagnoses:  Tonsillitis   Take antibiotic in completion. Continue to stay well-hydrated. Gargle warm salt water and spit it out. Continued to alternate between Tylenol and ibuprofen for pain. May consider over-the-counter Benadryl for additional relief. Followup with your primary care doctor in 5-7 days for recheck of  ongoing symptoms that return to emergency department for emergent changing or worsening of symptoms.  I personally performed the services described in this documentation, which was scribed in my presence. The recorded information has been reviewed and is accurate.   Marlon Peliffany Ranell Skibinski, PA-C 03/21/15 1322  Donnetta HutchingBrian Cook, MD 03/21/15 [phone]

## 2015-09-14 IMAGING — DX DG KNEE COMPLETE 4+V*R*
4 series · 4 of 4 positions shown · non-contrast
Comparison: None.

CLINICAL DATA: Generalized right knee pain

EXAM:
RIGHT KNEE - COMPLETE 4+ VIEW

[knee ap]
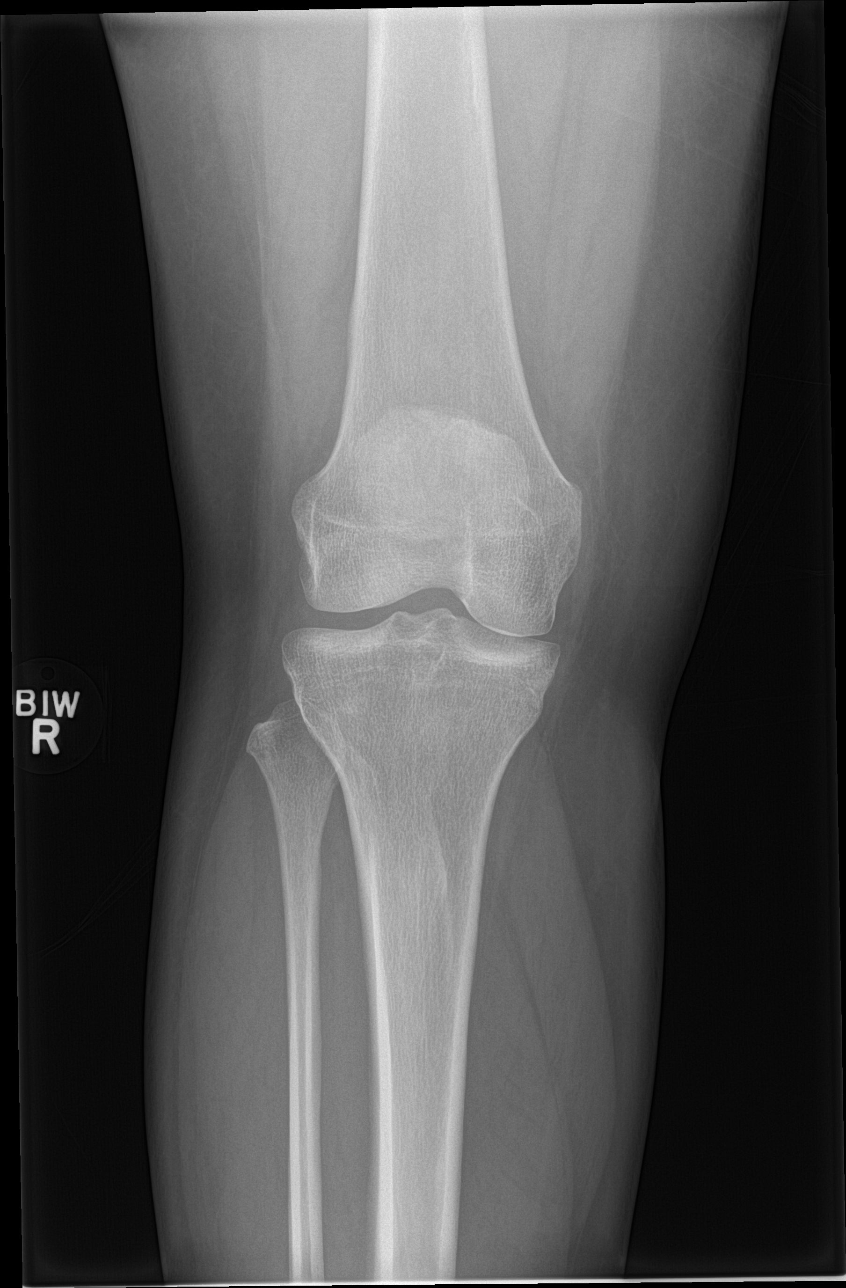

[knee obl (1 of 2)]
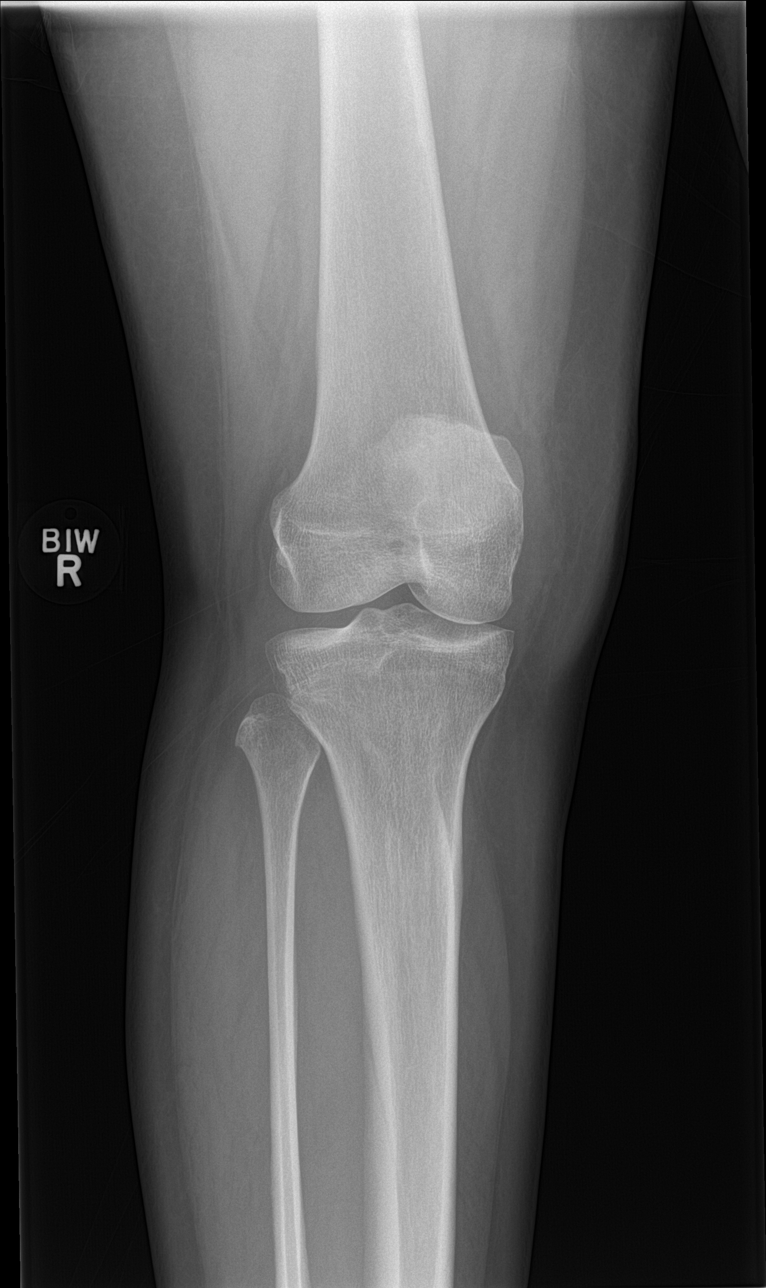

[knee obl (2 of 2)]
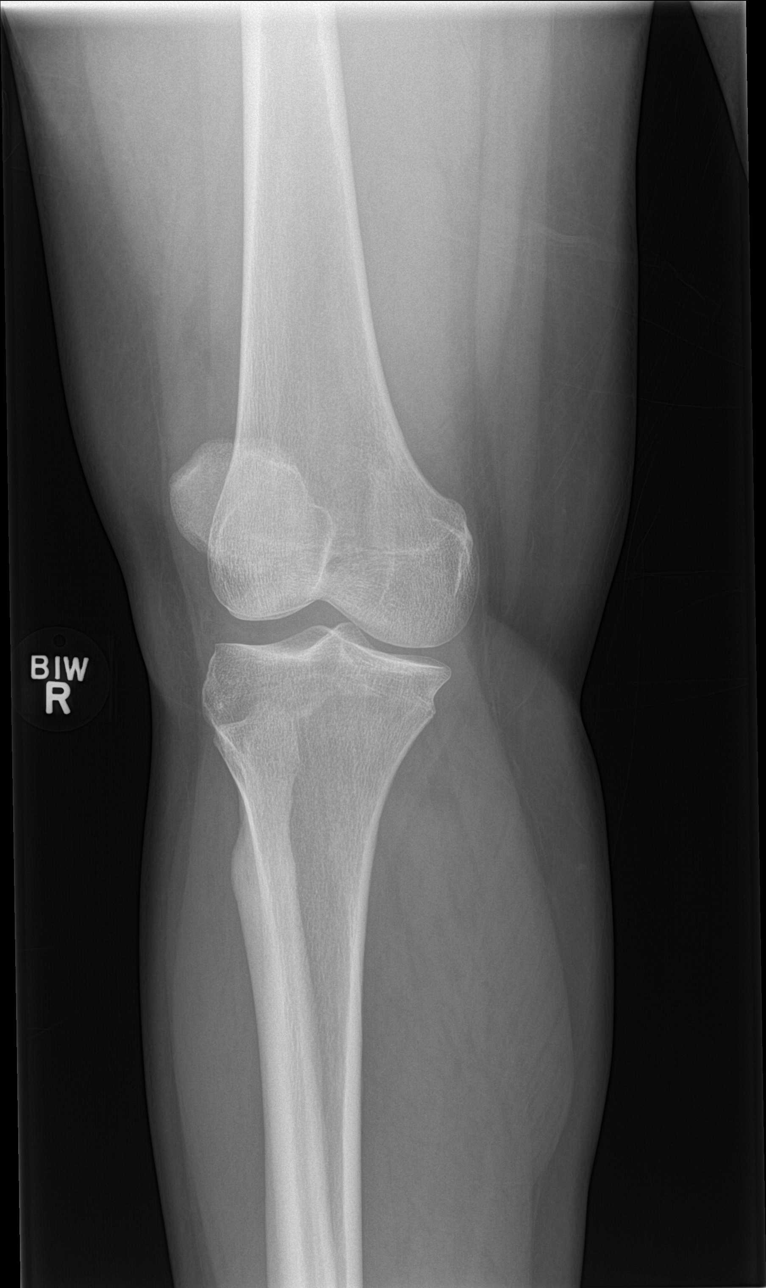

[knee lat]
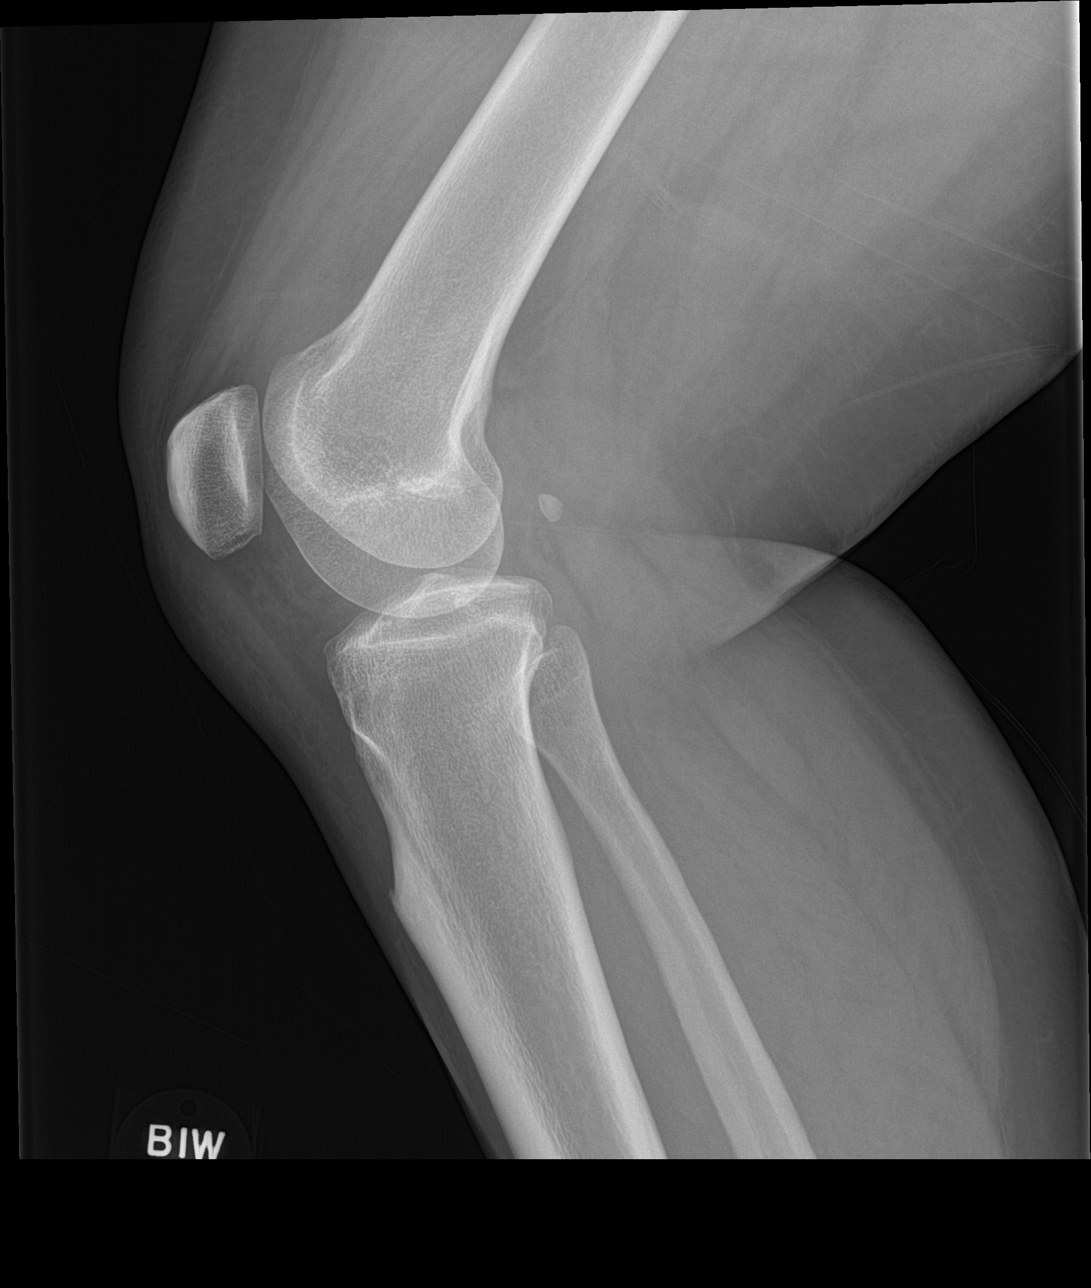

[4 of 4 positions shown; findings below may reference images not displayed]

FINDINGS: The right knee demonstrates no acute fracture or dislocation. There
is no significant joint effusion.
IMPRESSION: No acute osseous injury of the right knee.

## 2015-10-05 ENCOUNTER — Inpatient Hospital Stay (HOSPITAL_COMMUNITY)
Admission: AD | Admit: 2015-10-05 | Discharge: 2015-10-05 | Disposition: A | Discharge status: Home / Self Care | Payer: Medicare Other | Source: Ambulatory Visit | Attending: Obstetrics & Gynecology | Admitting: Obstetrics & Gynecology

## 2015-10-05 ENCOUNTER — Encounter (HOSPITAL_COMMUNITY): Payer: Self-pay | Admitting: *Deleted

## 2015-10-05 DIAGNOSIS — M545 Low back pain, unspecified: Secondary | ICD-10-CM

## 2015-10-05 DIAGNOSIS — Z3202 Encounter for pregnancy test, result negative: Secondary | ICD-10-CM | POA: Diagnosis not present

## 2015-10-05 DIAGNOSIS — Z9851 Tubal ligation status: Secondary | ICD-10-CM | POA: Diagnosis not present

## 2015-10-05 DIAGNOSIS — R109 Unspecified abdominal pain: Secondary | ICD-10-CM | POA: Diagnosis present

## 2015-10-05 LAB — URINALYSIS, ROUTINE W REFLEX MICROSCOPIC
Bilirubin Urine: NEGATIVE
GLUCOSE, UA: NEGATIVE mg/dL
Ketones, ur: NEGATIVE mg/dL
LEUKOCYTES UA: NEGATIVE
Nitrite: NEGATIVE
PROTEIN: NEGATIVE mg/dL
Specific Gravity, Urine: 1.02 (ref 1.005–1.030)
pH: 5.5 (ref 5.0–8.0)

## 2015-10-05 LAB — WET PREP, GENITAL
Clue Cells Wet Prep HPF POC: NONE SEEN
SPERM: NONE SEEN
Trich, Wet Prep: NONE SEEN
Yeast Wet Prep HPF POC: NONE SEEN

## 2015-10-05 LAB — URINE MICROSCOPIC-ADD ON

## 2015-10-05 LAB — POCT PREGNANCY, URINE: PREG TEST UR: NEGATIVE

## 2015-10-05 NOTE — MAU Note (Signed)
Feeling "weird" lately. Had my tubes tied but want to be sure everything is ok. Having some lower back pain and cramping on occ. LMP 7/3 and lasted 2 days and then stopped and then started couple days. Was light. Noticed when i wipe sometimes see pink on tissue. Did upt and was negative

## 2015-10-05 NOTE — Progress Notes (Signed)
J Rasch NP in earlier to discuss test results and d/c plan. Written and verbal d/c instructions given and understanding voiced. 

## 2015-10-05 NOTE — Discharge Instructions (Signed)

## 2015-10-05 NOTE — MAU Provider Note (Signed)
History     CSN: [id number]  Arrival date and time: 10/05/15 2106   First Provider Initiated Contact with Patient 10/05/15 2129      Chief Complaint  Patient presents with  . Abdominal Pain  . Back Pain   HPI   [name] is a 30 y.o. [id number] non-pregnant female with a history of tubal ligation here with lower back pain and concerns for pregnancy. She had her tubes tied 2 years ago. The lower back pain is lower and both sides, she has not taken anything for the pain. The pain comes and goes and does not come everyday. The pain feels like sharp at times, other times "just there".  She denies problems with bowel or bladder function.   She is "Feeling weird", and wanting to know "if I am pregnant". She noted a scant amount of blood on toilet paper and it has been off and on for a few days "want to make sure I am not pregnant in my tube".   OB History    Gravida Para Term Preterm AB TAB SAB Ectopic Multiple Living   3 3 3  0 0 0 0 0 0 3      Past Medical History  Diagnosis Date  . GERD (gastroesophageal reflux disease)   . Anemia     Past Surgical History  Procedure Laterality Date  . Cesarean section    . Cesarean section Bilateral 05/10/2013    Procedure: REPEAT CESAREAN SECTION with Bilateral Tubal Ligation;  Surgeon: Adam PhenixJames G Arnold, MD;  Location: [facility];  Service: Obstetrics;  Laterality: Bilateral;    History reviewed. No pertinent family history.  Social History  Substance Use Topics  . Smoking status: Never Smoker   . Smokeless tobacco: None  . Alcohol Use: No    Allergies: No Known Allergies  Prescriptions prior to admission  Medication Sig Dispense Refill Last Dose  . IRON PO Take 1 tablet by mouth 3 (three) times a week.   Past Week at Unknown time  . ibuprofen (ADVIL,MOTRIN) 600 MG tablet Take 1 tablet (600 mg total) by mouth every 6 (six) hours. 30 tablet 0   . oxyCODONE-acetaminophen (PERCOCET/ROXICET) 5-325 MG per tablet Take 1-2 tablets by mouth  every 4 (four) hours as needed for severe pain (moderate - severe pain). 30 tablet 0   . penicillin v potassium (VEETID) 500 MG tablet Take 1 tablet (500 mg total) by mouth 3 (three) times daily. 30 tablet 0   . Prenatal Vit-Fe Fumarate-FA (MULTIVITAMIN-PRENATAL) 27-0.8 MG TABS tablet Take 1 tablet by mouth daily at 12 noon.   05/09/2013 at Unknown time   Results for orders placed or performed during the hospital encounter of 10/05/15 (from the past 48 hour(s))  Urinalysis, Routine w reflex microscopic ([facility])     Status: Abnormal   Collection Time: 10/05/15  9:30 PM  Result Value Ref Range   Color, Urine YELLOW YELLOW   APPearance CLEAR CLEAR   Specific Gravity, Urine 1.020 1.005 - 1.030   pH 5.5 5.0 - 8.0   Glucose, UA NEGATIVE NEGATIVE mg/dL   Hgb urine dipstick TRACE (A) NEGATIVE   Bilirubin Urine NEGATIVE NEGATIVE   Ketones, ur NEGATIVE NEGATIVE mg/dL   Protein, ur NEGATIVE NEGATIVE mg/dL   Nitrite NEGATIVE NEGATIVE   Leukocytes, UA NEGATIVE NEGATIVE  Urine microscopic-add on     Status: Abnormal   Collection Time: 10/05/15  9:30 PM  Result Value Ref Range   Squamous Epithelial / LPF  0-5 (A) NONE SEEN   WBC, UA 0-5 0 - 5 WBC/hpf   RBC / HPF 0-5 0 - 5 RBC/hpf   Bacteria, UA RARE (A) NONE SEEN  Pregnancy, urine POC     Status: None   Collection Time: 10/05/15  9:34 PM  Result Value Ref Range   Preg Test, Ur NEGATIVE NEGATIVE    Comment:        THE SENSITIVITY OF THIS METHODOLOGY IS >24 mIU/mL   Wet prep, genital     Status: Abnormal   Collection Time: 10/05/15  9:35 PM  Result Value Ref Range   Yeast Wet Prep HPF POC NONE SEEN NONE SEEN   Trich, Wet Prep NONE SEEN NONE SEEN   Clue Cells Wet Prep HPF POC NONE SEEN NONE SEEN   WBC, Wet Prep HPF POC FEW (A) NONE SEEN    Comment: FEW BACTERIA SEEN   Sperm NONE SEEN      Review of Systems  Constitutional: Negative for fever and chills.  Gastrointestinal: Negative for nausea, vomiting and abdominal pain.   Musculoskeletal: Positive for back pain.   Physical Exam   Blood pressure 128/81, pulse 81, temperature 98.2 F (36.8 C), resp. rate 18, height  (1.651 m), weight 201 lb 6.4 oz (91.354 kg), last menstrual period 09/23/2015, currently breastfeeding.  Physical Exam  Constitutional: She is oriented to person, place, and time. Vital signs are normal. She appears well-developed and well-nourished.  Non-toxic appearance. She does not have a sickly appearance. She does not appear ill. No distress.  HENT:  Head: Normocephalic.  Eyes: Pupils are equal, round, and reactive to light.  Genitourinary:  Speculum exam: Vagina - Small amount of creamy discharge, no odor Cervix - No contact bleeding, no active bleeding  Bimanual exam: Cervix closed, no CMT  Uterus non tender, normal size Adnexa non tender, no masses bilaterally GC/Chlam, wet prep done Chaperone present for exam.  Musculoskeletal: Normal range of motion.       Lumbar back: She exhibits normal range of motion, no tenderness, no swelling, no edema, no pain and no spasm.  Neurological: She is alert and oriented to person, place, and time.  Skin: Skin is warm. She is not diaphoretic.    MAU Course  Procedures  None  MDM  Wet prep GC  Assessment and Plan   A:  1. Bilateral low back pain without sciatica   2. Encounter for pregnancy test with result negative     P:  Discharge home in stable condition Return to MAU for emergencies Back exercises discussed, if no improvement follow up with PCP At home pregnancy tests encouraged, discussed OK to take ibuprofen for pain as needed, as directed on the bottle.   Duane Lope, NP 10/05/2015 10:07 PM

## 2015-10-07 LAB — GC/CHLAMYDIA PROBE AMP (~~LOC~~) NOT AT ARMC
Chlamydia: NEGATIVE
Neisseria Gonorrhea: NEGATIVE

## 2016-09-20 ENCOUNTER — Encounter (HOSPITAL_COMMUNITY): Payer: Self-pay | Admitting: Emergency Medicine

## 2016-09-20 ENCOUNTER — Emergency Department (HOSPITAL_COMMUNITY)
Admission: EM | Admit: 2016-09-20 | Discharge: 2016-09-20 | Disposition: A | Discharge status: Home / Self Care | Payer: Medicare Other | Attending: Emergency Medicine | Admitting: Emergency Medicine

## 2016-09-20 DIAGNOSIS — T7411XA Adult physical abuse, confirmed, initial encounter: Secondary | ICD-10-CM | POA: Diagnosis not present

## 2016-09-20 DIAGNOSIS — Z79899 Other long term (current) drug therapy: Secondary | ICD-10-CM | POA: Insufficient documentation

## 2016-09-20 DIAGNOSIS — D649 Anemia, unspecified: Secondary | ICD-10-CM | POA: Diagnosis not present

## 2016-09-20 DIAGNOSIS — Z0471 Encounter for examination and observation following alleged adult physical abuse: Secondary | ICD-10-CM | POA: Diagnosis present

## 2016-09-20 DIAGNOSIS — Z791 Long term (current) use of non-steroidal anti-inflammatories (NSAID): Secondary | ICD-10-CM | POA: Diagnosis not present

## 2016-09-20 DIAGNOSIS — T7491XA Unspecified adult maltreatment, confirmed, initial encounter: Secondary | ICD-10-CM
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
## 2018-07-09 ENCOUNTER — Emergency Department (HOSPITAL_COMMUNITY)
Admission: EM | Admit: 2018-07-09 | Discharge: 2018-07-09 | Disposition: A | Discharge status: Home / Self Care | Payer: Medicare Other | Attending: Emergency Medicine | Admitting: Emergency Medicine

## 2018-07-09 ENCOUNTER — Other Ambulatory Visit: Payer: Self-pay

## 2018-07-09 ENCOUNTER — Encounter (HOSPITAL_COMMUNITY): Payer: Self-pay

## 2018-07-09 DIAGNOSIS — R0981 Nasal congestion: Secondary | ICD-10-CM | POA: Diagnosis not present

## 2018-07-09 DIAGNOSIS — R05 Cough: Secondary | ICD-10-CM | POA: Diagnosis not present

## 2018-07-09 DIAGNOSIS — J029 Acute pharyngitis, unspecified: Secondary | ICD-10-CM | POA: Diagnosis not present

## 2018-07-09 MED ORDER — AMOXICILLIN 500 MG PO CAPS: 500.0000 mg | ORAL_CAPSULE | Freq: Three times a day (TID) | ORAL | 0 refills | Status: DC

## 2018-07-09 NOTE — Discharge Instructions (Signed)
Contact a health care provider if:  You have a fever for more than 2-3 days.  You have symptoms that last (are persistent) for more than 2-3 days.  Your throat does not get better within 7 days.  You have a fever and your symptoms suddenly get worse.  Your child who is 3 months to 34 years old has a temperature of 102.2F (39C) or higher.  Get help right away if:  You have difficulty breathing.  You cannot swallow fluids, soft foods, or your saliva.  You have increased swelling in your throat or neck.  You have persistent nausea and vomiting.

## 2018-07-09 NOTE — ED Notes (Signed)
Patient verbalizes understanding of discharge instructions. Opportunity for questioning and answers were provided. Armband removed by staff, pt discharged from ED.  

## 2018-07-09 NOTE — ED Triage Notes (Signed)
Pt from home w/ a c/o a runny nose, congestion, slight cough (non-productive), and sore throat for the past couple of days. No SOB. No N/V/D. No fever or chills.

## 2018-07-09 NOTE — ED Provider Notes (Signed)
[facility] EMERGENCY DEPARTMENT Provider Note   CSN: [id number] Arrival date & time: 07/09/18  1721    History   Chief Complaint Chief Complaint  Patient presents with  . Sore Throat  . Nasal Congestion    HPI Lori Leon is a 33 y.o. female.  Presents emergency department chief complaint of nasal congestion days ago.  She has noticed change in her voice.  She is not have any trouble swallowing.  She has never contacts with similar symptoms.  She denies fever.  Had a slight cough.  She denies a history of seasonal allergies.    HPI  Past Medical History:  Diagnosis Date  . Anemia   . GERD (gastroesophageal reflux disease)     Patient Active Problem List   Diagnosis Date Noted  . Cesarean delivery delivered 05/10/2013    Past Surgical History:  Procedure Laterality Date  . CESAREAN SECTION    . CESAREAN SECTION Bilateral 05/10/2013   Procedure: REPEAT CESAREAN SECTION with Bilateral Tubal Ligation;  Surgeon: Adam Phenix, MD;  Location: [facility];  Service: Obstetrics;  Laterality: Bilateral;     OB History    Gravida  3   Para  3   Term  3   Preterm  0   AB  0   Living  3     SAB  0   TAB  0   Ectopic  0   Multiple  0   Live Births  3            Home Medications    Prior to Admission medications   Medication Sig Start Date End Date Taking? Authorizing Provider  amoxicillin (AMOXIL) 500 MG capsule Take 1 capsule (500 mg total) by mouth 3 (three) times daily. 07/09/18   Staley Lunz, Cammy Copa, PA-C  Aspirin-Salicylamide-Caffeine (BC HEADACHE POWDER PO) Take 1 packet by mouth daily as needed (pain).    [provider]  IRON PO Take 1 tablet by mouth 3 (three) times a week.    [provider]    Family History History reviewed. No pertinent family history.  Social History Social History   Tobacco Use  . Smoking status: Never Smoker  . Smokeless tobacco: Never Used  Substance Use Topics  . Alcohol use: No   . Drug use: No     Allergies   Patient has no known allergies.   Review of Systems Review of Systems  Ten systems reviewed and are negative for acute change, except as noted in the HPI.   Physical Exam Updated Vital Signs BP 134/80 (BP Location: Right Arm)   Pulse 92   Temp 98.9 F (37.2 C) (Oral)   Resp 16   Ht 5\' 6"  (1.676 m)   SpO2 98%   BMI 35.51 kg/m   Physical Exam Physical Exam  Nursing note and vitals reviewed. Constitutional: She is oriented to person, place, and time. She appears well-developed and well-nourished. No distress.  HENT:  Head: Normocephalic and atraumatic.  Eyes: Conjunctivae normal and EOM are normal. Pupils are equal, round, and reactive to light. No scleral icterus.  Neck: Normal range of motion. Bilateral tonsillar adenopathy. Mouth: Mild erythema of the posterior oropharynx.  No exudates.  Uvula midline. Cardiovascular: Normal rate, regular rhythm and normal heart sounds.  Exam reveals no gallop and no friction rub.   No murmur heard. Pulmonary/Chest: Effort normal and breath sounds normal. No respiratory distress.  Abdominal: Soft. Bowel sounds are normal. She exhibits no  distension and no mass. There is no tenderness. There is no guarding.  Neurological: She is alert and oriented to person, place, and time.  Skin: Skin is warm and dry. She is not diaphoretic.     ED Treatments / Results  Labs (all labs ordered are listed, but only abnormal results are displayed) Labs Reviewed - No data to display  EKG None  Radiology No results found.  Procedures Procedures (including critical care time)  Medications Ordered in ED Medications - No data to display   Initial Impression / Assessment and Plan / ED Course  I have reviewed the triage vital signs and the nursing notes.  Pertinent labs & imaging results that were available during my care of the patient were reviewed by me and considered in my medical decision making (see chart  for details).        Patient with sore throat and nasal congestion, likely viral URI however she does have poor deep predominantly sore throat throat as the complaint.  Will treat with amoxicillin.  Avoiding nasopharyngeal swabs during global pandemic.  I have very low suspicion that she has symptoms of coronavirus however asked her to self isolate.  Gust return precautions.  Patient appears appropriate for discharge at this time  Lori Leon was evaluated in Emergency Department on 07/09/2018 for the symptoms described in the history of present illness. She was evaluated in the context of the global COVID-19 pandemic, which necessitated consideration that the patient might be at risk for infection with the SARS-CoV-2 virus that causes COVID-19. Institutional protocols and algorithms that pertain to the evaluation of patients at risk for COVID-19 are in a state of rapid change based on information released by regulatory bodies including the CDC and federal and state organizations. These policies and algorithms were followed during the patient's care in the ED.   Final Clinical Impressions(s) / ED Diagnoses   Final diagnoses:  Sore throat    ED Discharge Orders         Ordered    amoxicillin (AMOXIL) 500 MG capsule  3 times daily     07/09/18 1900           Arthor CaptainHarris, Francoise Chojnowski, PA-C 07/09/18 2224    Melene PlanFloyd, Dan, DO 07/11/18 1013

## 2019-04-24 ENCOUNTER — Ambulatory Visit: Payer: Medicare Other | Admitting: Obstetrics

## 2019-04-24 DIAGNOSIS — Z01419 Encounter for gynecological examination (general) (routine) without abnormal findings: Secondary | ICD-10-CM

## 2019-08-12 ENCOUNTER — Emergency Department (HOSPITAL_COMMUNITY)
Admission: EM | Admit: 2019-08-12 | Discharge: 2019-08-12 | Disposition: A | Discharge status: Home / Self Care | Payer: Medicare Other | Attending: Emergency Medicine | Admitting: Emergency Medicine

## 2019-08-12 ENCOUNTER — Other Ambulatory Visit: Payer: Self-pay

## 2019-08-12 ENCOUNTER — Encounter (HOSPITAL_COMMUNITY): Payer: Self-pay | Admitting: Emergency Medicine

## 2019-08-12 DIAGNOSIS — R251 Tremor, unspecified: Secondary | ICD-10-CM | POA: Diagnosis present

## 2019-08-12 DIAGNOSIS — R0602 Shortness of breath: Secondary | ICD-10-CM | POA: Diagnosis not present

## 2019-08-12 DIAGNOSIS — R4583 Excessive crying of child, adolescent or adult: Secondary | ICD-10-CM | POA: Insufficient documentation

## 2019-08-12 DIAGNOSIS — F410 Panic disorder [episodic paroxysmal anxiety] without agoraphobia: Secondary | ICD-10-CM | POA: Insufficient documentation

## 2019-08-12 MED ORDER — LORAZEPAM 1 MG PO TABS
1.0000 mg | ORAL_TABLET | Freq: Once | ORAL | Status: AC
  Administered 2019-08-12: 1 mg via ORAL
  Filled 2019-08-12: qty 1

## 2019-08-12 NOTE — ED Provider Notes (Signed)
[facility] EMERGENCY DEPARTMENT Provider Note   CSN: [id number] Arrival date & time: 08/12/19  1147     History Chief Complaint  Patient presents with  . episode of shaking    Lori Leon is a 34 y.o. female who presents with shaking and crying. She states she was in her car this morning and started to have shaking of her hands and feet bilaterally along with crying. This lasted a couple minutes and then resolved. She went to work and it happened again. She talked to her sister on the phone who thought she was having a panic attack. She states she's never felt this way before so decided to come to the ED. She denies being treated for anxiety or depression. She denies a reason that she would have a panic attack and states her personal and work life are going well. She denies lightheadedness, dizziness, headache, chest pain, SOB, abdominal pain, N/V/D, dysuria. No ETOH or drug use. Since she has been here she feels overall better but states that she feels like it might happen again.  HPI     Past Medical History:  Diagnosis Date  . Anemia   . GERD (gastroesophageal reflux disease)     Patient Active Problem List   Diagnosis Date Noted  . Cesarean delivery delivered 05/10/2013    Past Surgical History:  Procedure Laterality Date  . CESAREAN SECTION    . CESAREAN SECTION Bilateral 05/10/2013   Procedure: REPEAT CESAREAN SECTION with Bilateral Tubal Ligation;  Surgeon: Woodroe Mode, MD;  Location: [facility];  Service: Obstetrics;  Laterality: Bilateral;     OB History    Gravida  3   Para  3   Term  3   Preterm  0   AB  0   Living  3     SAB  0   TAB  0   Ectopic  0   Multiple  0   Live Births  3           No family history on file.  Social History   Tobacco Use  . Smoking status: Never Smoker  . Smokeless tobacco: Never Used  Substance Use Topics  . Alcohol use: No  . Drug use: No    Home Medications Prior to Admission  medications   Medication Sig Start Date End Date Taking? Authorizing Provider  amoxicillin (AMOXIL) 500 MG capsule Take 1 capsule (500 mg total) by mouth 3 (three) times daily. 07/09/18   Harris, Vernie Shanks, PA-C  Aspirin-Salicylamide-Caffeine (BC HEADACHE POWDER PO) Take 1 packet by mouth daily as needed (pain).    [provider]  IRON PO Take 1 tablet by mouth 3 (three) times a week.    [provider]    Allergies    Patient has no known allergies.  Review of Systems   Review of Systems  Constitutional: Negative for chills and fever.  Respiratory: Positive for shortness of breath (resolved).   Cardiovascular: Negative for chest pain.  Gastrointestinal: Negative for abdominal pain, diarrhea, nausea and vomiting.  Genitourinary: Negative for dysuria.  Neurological: Positive for tremors (resolved). Negative for dizziness and headaches.  Psychiatric/Behavioral: Positive for dysphoric mood (resolved).    Physical Exam Updated Vital Signs BP 131/86 (BP Location: Left Arm)   Pulse 96   Temp 99.2 F (37.3 C) (Oral)   Resp 16   Ht 5\' 6"  (1.676 m)   Wt 113.9 kg   LMP 07/19/2019   SpO2 98%  BMI 40.51 kg/m   Physical Exam Vitals and nursing note reviewed.  Constitutional:      General: She is not in acute distress.    Appearance: Normal appearance. She is well-developed. She is not ill-appearing.  HENT:     Head: Normocephalic and atraumatic.  Eyes:     General: No scleral icterus.       Right eye: No discharge.        Left eye: No discharge.     Conjunctiva/sclera: Conjunctivae normal.     Pupils: Pupils are equal, round, and reactive to light.  Cardiovascular:     Rate and Rhythm: Normal rate and regular rhythm.  Pulmonary:     Effort: Pulmonary effort is normal. No respiratory distress.     Breath sounds: Normal breath sounds.  Abdominal:     General: There is no distension.     Tenderness: There is no abdominal tenderness.  Musculoskeletal:      Cervical back: Normal range of motion.  Skin:    General: Skin is warm and dry.  Neurological:     Mental Status: She is alert and oriented to person, place, and time.  Psychiatric:        Mood and Affect: Mood is anxious.        Behavior: Behavior normal.     ED Results / Procedures / Treatments   Labs (all labs ordered are listed, but only abnormal results are displayed) Labs Reviewed - No data to display  EKG None  Radiology No results found.  Procedures Procedures (including critical care time)  Medications Ordered in ED Medications - No data to display  ED Course  I have reviewed the triage vital signs and the nursing notes.  Pertinent labs & imaging results that were available during my care of the patient were reviewed by me and considered in my medical decision making (see chart for details).  34 year old female presents with shaking and crying episodes today but cannot identify an inciting factor. Her vitals are normal. Exam is unremarkable. She is calm at the time of my evaluation but feels like symptoms might recur. Symptoms sound consistent with panic attack. Will give dose of Ativan and reassess.  On reassessment, she is still calm, comfortable. Has tolerated PO. Will d/c home. Recommended PCP f/u or mental health provider.  MDM Rules/Calculators/A&P                       Final Clinical Impression(s) / ED Diagnoses Final diagnoses:  Panic attack    Rx / DC Orders ED Discharge Orders    None       Bethel Born, PA-C 08/12/19 1524    Gerhard Munch, MD 08/12/19 1535

## 2019-08-12 NOTE — ED Triage Notes (Signed)
States bilateral hands and legs started shaking while sitting in her car this morning outside of work.  States she was eating a parfait and called her Production designer, theatre/television/film.  Started feeling better and went into work.  States she was sitting down at work and it started happening again so she came to hospital.  States she was crying during both episodes.  States she feels fine right now.

## 2019-08-12 NOTE — Discharge Instructions (Signed)
Please rest today and stay with family or friends If you continue to have recurrent panic attacks please follow up with your PCP or a mental health provider

## 2020-01-20 ENCOUNTER — Emergency Department (HOSPITAL_COMMUNITY)
Admission: EM | Admit: 2020-01-20 | Discharge: 2020-01-20 | Disposition: A | Discharge status: Home / Self Care | Payer: Medicare Other | Attending: Emergency Medicine | Admitting: Emergency Medicine

## 2020-01-20 ENCOUNTER — Other Ambulatory Visit: Payer: Self-pay

## 2020-01-20 ENCOUNTER — Encounter (HOSPITAL_COMMUNITY): Payer: Self-pay | Admitting: *Deleted

## 2020-01-20 DIAGNOSIS — Z5321 Procedure and treatment not carried out due to patient leaving prior to being seen by health care provider: Secondary | ICD-10-CM | POA: Insufficient documentation

## 2020-01-20 DIAGNOSIS — M25522 Pain in left elbow: Secondary | ICD-10-CM | POA: Insufficient documentation

## 2020-01-20 NOTE — ED Triage Notes (Signed)
The pt is c/o lt elbow pain after she came home from work  No known injury no lifting ather job no swelling visible no previous history radial pulse present  lmp last month

## 2020-01-30 ENCOUNTER — Other Ambulatory Visit: Payer: Self-pay

## 2020-01-30 ENCOUNTER — Other Ambulatory Visit (HOSPITAL_COMMUNITY)
Admission: RE | Admit: 2020-01-30 | Discharge: 2020-01-30 | Disposition: A | Discharge status: Home / Self Care | Payer: Medicare Other | Source: Ambulatory Visit | Attending: Obstetrics | Admitting: Obstetrics

## 2020-01-30 ENCOUNTER — Ambulatory Visit (INDEPENDENT_AMBULATORY_CARE_PROVIDER_SITE_OTHER): Payer: Medicare Other | Admitting: Obstetrics

## 2020-01-30 ENCOUNTER — Encounter: Payer: Self-pay | Admitting: Obstetrics

## 2020-01-30 VITALS — BP 132/85 | HR 75 | Wt 229.0 lb

## 2020-01-30 DIAGNOSIS — Z124 Encounter for screening for malignant neoplasm of cervix: Secondary | ICD-10-CM

## 2020-01-30 DIAGNOSIS — Z01419 Encounter for gynecological examination (general) (routine) without abnormal findings: Secondary | ICD-10-CM

## 2020-01-30 DIAGNOSIS — N898 Other specified noninflammatory disorders of vagina: Secondary | ICD-10-CM

## 2020-01-30 DIAGNOSIS — Z113 Encounter for screening for infections with a predominantly sexual mode of transmission: Secondary | ICD-10-CM | POA: Diagnosis not present

## 2020-01-30 NOTE — Progress Notes (Signed)
New pt here for annual exam.  Pt states several years since last pap. Pt goes to [facility] for PCP.   Pt has no concerns today.
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
## 2020-01-31 LAB — HEPATITIS C ANTIBODY: Hep C Virus Ab: <0.1 s/co ratio (ref 0.0–0.9)

## 2020-01-31 LAB — CYTOLOGY - PAP
Comment: NEGATIVE
Diagnosis: NEGATIVE
High risk HPV: NEGATIVE

## 2020-01-31 LAB — HEPATITIS B SURFACE ANTIGEN: Hepatitis B Surface Ag: NEGATIVE

## 2020-01-31 LAB — HIV ANTIBODY (ROUTINE TESTING W REFLEX): HIV Screen 4th Generation wRfx: NONREACTIVE

## 2020-01-31 LAB — RPR: RPR Ser Ql: NONREACTIVE

## 2020-02-01 ENCOUNTER — Other Ambulatory Visit: Payer: Self-pay | Admitting: Obstetrics

## 2020-02-01 ENCOUNTER — Telehealth: Payer: Self-pay

## 2020-02-01 DIAGNOSIS — N760 Acute vaginitis: Secondary | ICD-10-CM

## 2020-02-01 DIAGNOSIS — A749 Chlamydial infection, unspecified: Secondary | ICD-10-CM

## 2020-02-01 DIAGNOSIS — B9689 Other specified bacterial agents as the cause of diseases classified elsewhere: Secondary | ICD-10-CM

## 2020-02-01 LAB — CERVICOVAGINAL ANCILLARY ONLY
Bacterial Vaginitis (gardnerella): NEGATIVE
Candida Glabrata: NEGATIVE
Candida Vaginitis: NEGATIVE
Chlamydia: POSITIVE — AB
Comment: NEGATIVE
Comment: NEGATIVE
Comment: NEGATIVE
Comment: NEGATIVE
Comment: NEGATIVE
Comment: NORMAL
Neisseria Gonorrhea: NEGATIVE
Trichomonas: POSITIVE — AB

## 2020-02-01 MED ORDER — DOXYCYCLINE HYCLATE 100 MG PO CAPS: 100.0000 mg | ORAL_CAPSULE | Freq: Two times a day (BID) | ORAL | 0 refills | Status: DC

## 2020-02-01 MED ORDER — METRONIDAZOLE 500 MG PO TABS: 500.0000 mg | ORAL_TABLET | Freq: Two times a day (BID) | ORAL | 2 refills | Status: DC

## 2020-02-01 NOTE — Telephone Encounter (Signed)
Patient informed of results and that rx has been sent to the pharmacy for treatment. Patient informed that these are STDs and she will need to inform her partner for treatment. Also advised that they both abstain from sexual intercourse for about 2 weeks after she and her partner has completed treatment. Patient verbalized understanding. [facility] form sent to pharmacy.

## 2020-02-01 NOTE — Telephone Encounter (Signed)
-----   Message from Brock Bad, MD sent at 02/01/2020  1:08 PM EST ----- Please call patient with positive labs: Chlamydia.  Doxycycline Rx. Trichomonas.  Flagyl Rx  Brock Bad MD 02/01/2020 1:11 PM

## 2020-03-29 ENCOUNTER — Other Ambulatory Visit: Payer: Self-pay

## 2020-03-29 ENCOUNTER — Other Ambulatory Visit (HOSPITAL_COMMUNITY)
Admission: RE | Admit: 2020-03-29 | Discharge: 2020-03-29 | Disposition: A | Discharge status: Home / Self Care | Payer: Medicare Other | Source: Ambulatory Visit | Attending: Obstetrics | Admitting: Obstetrics

## 2020-03-29 ENCOUNTER — Ambulatory Visit (INDEPENDENT_AMBULATORY_CARE_PROVIDER_SITE_OTHER): Payer: Medicare Other | Admitting: Obstetrics

## 2020-03-29 ENCOUNTER — Encounter: Payer: Self-pay | Admitting: Obstetrics

## 2020-03-29 VITALS — BP 144/98 | HR 86 | Ht 66.0 in | Wt 228.0 lb

## 2020-03-29 DIAGNOSIS — N898 Other specified noninflammatory disorders of vagina: Secondary | ICD-10-CM | POA: Insufficient documentation

## 2020-03-29 DIAGNOSIS — N766 Ulceration of vulva: Secondary | ICD-10-CM

## 2020-03-29 NOTE — Progress Notes (Signed)
Patient ID: Lori Leon, female   DOB: [birth year], 35 y.o.   MRN: [id number]  Chief Complaint  Patient presents with  . Vaginitis    HPI Lori Leon is a 35 y.o. female.  Complains of vaginal discharge and irritation.  Has some painful areas on vulva from scratching. HPI  Past Medical History:  Diagnosis Date  . Anemia   . GERD (gastroesophageal reflux disease)     Past Surgical History:  Procedure Laterality Date  . CESAREAN SECTION    . CESAREAN SECTION Bilateral 05/10/2013   Procedure: REPEAT CESAREAN SECTION with Bilateral Tubal Ligation;  Surgeon: Adam Phenix, MD;  Location: [facility];  Service: Obstetrics;  Laterality: Bilateral;    Family History  Problem Relation Age of Onset  . Hypertension Mother     Social History Social History   Tobacco Use  . Smoking status: Never Smoker  . Smokeless tobacco: Never Used  Vaping Use  . Vaping Use: Never used  Substance Use Topics  . Alcohol use: No  . Drug use: No    No Known Allergies  Current Outpatient Medications  Medication Sig Dispense Refill  . IRON PO Take 1 tablet by mouth 3 (three) times a week.    . Aspirin-Salicylamide-Caffeine (BC HEADACHE POWDER PO) Take 1 packet by mouth daily as needed (pain). (Patient not taking: Reported on 03/29/2020)    . metroNIDAZOLE (FLAGYL) 500 MG tablet Take 1 tablet (500 mg total) by mouth 2 (two) times daily. (Patient not taking: Reported on 03/29/2020) 14 tablet 2   No current facility-administered medications for this visit.    Review of Systems Review of Systems Constitutional: negative for fatigue and weight loss Respiratory: negative for cough and wheezing Cardiovascular: negative for chest pain, fatigue and palpitations Gastrointestinal: negative for abdominal pain and change in bowel habits Genitourinary: positive for vaginal discharge and irritation Integument/breast: negative for nipple discharge Musculoskeletal:negative for myalgias Neurological: negative for  gait problems and tremors Behavioral/Psych: negative for abusive relationship, depression Endocrine: negative for temperature intolerance      Blood pressure (!) 144/98, pulse 86, height 5\' 6"  (1.676 m), weight 228 lb (103.4 kg), last menstrual period 03/28/2020, currently breastfeeding.  Physical Exam Physical Exam           General:  Alert and no distress Abdomen:  normal findings: no organomegaly, soft, non-tender and no hernia  Pelvis:  External genitalia: normal general appearance Urinary system: urethral meatus normal and bladder without fullness, nontender Vaginal: normal without tenderness, induration or masses Cervix: normal appearance Adnexa: normal bimanual exam Uterus: anteverted and non-tender, normal size    50% of 15 min visit spent on counseling and coordination of care.   Data Reviewed Wet Prep and Cultures  Assessment     1. Vaginal discharge Rx: - Cervicovaginal ancillary only( [place])  2. Vaginal irritation - probable candida  3. Ulceration of vulva Rx: - Herpes simplex virus culture    Plan   Follow up prn  Orders Placed This Encounter  Procedures  . Herpes simplex virus culture      05/26/2020, MD 03/29/2020 11:22 AM

## 2020-03-29 NOTE — Progress Notes (Signed)
RGYN patient presents for problem visit   CC: pt notes having hemorrhoid at first applied cream to rectal area. Then noted vaginal itching and used cream to vaginal area and has since had irritation and notes swelling, and vaginal pain.  Pt denies having blisters, or bumps.

## 2020-04-01 ENCOUNTER — Other Ambulatory Visit: Payer: Self-pay | Admitting: Obstetrics

## 2020-04-01 DIAGNOSIS — A6000 Herpesviral infection of urogenital system, unspecified: Secondary | ICD-10-CM

## 2020-04-01 LAB — CERVICOVAGINAL ANCILLARY ONLY
Bacterial Vaginitis (gardnerella): POSITIVE — AB
Candida Glabrata: NEGATIVE
Candida Vaginitis: POSITIVE — AB
Chlamydia: NEGATIVE
Comment: NEGATIVE
Comment: NEGATIVE
Comment: NEGATIVE
Comment: NEGATIVE
Comment: NEGATIVE
Comment: NORMAL
Neisseria Gonorrhea: NEGATIVE
Trichomonas: NEGATIVE

## 2020-04-01 LAB — HERPES SIMPLEX VIRUS CULTURE

## 2020-04-01 MED ORDER — VALACYCLOVIR HCL 1 G PO TABS: 1000.0000 mg | ORAL_TABLET | Freq: Two times a day (BID) | ORAL | 0 refills | Status: AC

## 2020-04-02 ENCOUNTER — Other Ambulatory Visit: Payer: Self-pay | Admitting: Obstetrics

## 2020-04-02 DIAGNOSIS — B3731 Acute candidiasis of vulva and vagina: Secondary | ICD-10-CM

## 2020-04-02 DIAGNOSIS — B373 Candidiasis of vulva and vagina: Secondary | ICD-10-CM

## 2020-04-02 DIAGNOSIS — B9689 Other specified bacterial agents as the cause of diseases classified elsewhere: Secondary | ICD-10-CM

## 2020-04-02 MED ORDER — FLUCONAZOLE 150 MG PO TABS: 150.0000 mg | ORAL_TABLET | Freq: Once | ORAL | 0 refills | Status: AC

## 2020-04-02 MED ORDER — METRONIDAZOLE 500 MG PO TABS: 500.0000 mg | ORAL_TABLET | Freq: Two times a day (BID) | ORAL | 2 refills | Status: DC

## 2020-04-11 ENCOUNTER — Other Ambulatory Visit: Payer: Self-pay | Admitting: Internal Medicine

## 2020-04-12 LAB — COMPLETE METABOLIC PANEL WITH GFR
AG Ratio: 1.3 (calc) (ref 1.0–2.5)
ALT: 20 U/L (ref 6–29)
AST: 28 U/L (ref 10–30)
Albumin: 3.9 g/dL (ref 3.6–5.1)
Alkaline phosphatase (APISO): 52 U/L (ref 31–125)
BUN: 12 mg/dL (ref 7–25)
CO2: 25 mmol/L (ref 20–32)
Calcium: 8.9 mg/dL (ref 8.6–10.2)
Chloride: 105 mmol/L (ref 98–110)
Creat: 0.87 mg/dL (ref 0.50–1.10)
GFR, Est African American: 100 mL/min/{1.73_m2} (ref >=60)
GFR, Est Non African American: 86 mL/min/{1.73_m2} (ref >=60)
Globulin: 3.1 g/dL (calc) (ref 1.9–3.7)
Glucose, Bld: 78 mg/dL (ref 65–99)
Potassium: 3.8 mmol/L (ref 3.5–5.3)
Sodium: 138 mmol/L (ref 135–146)
Total Bilirubin: 0.3 mg/dL (ref 0.2–1.2)
Total Protein: 7 g/dL (ref 6.1–8.1)

## 2020-04-12 LAB — VITAMIN D 25 HYDROXY (VIT D DEFICIENCY, FRACTURES): Vit D, 25-Hydroxy: 26 ng/mL — ABNORMAL LOW (ref 30–100)

## 2020-04-12 LAB — LIPID PANEL
Cholesterol: 139 mg/dL (ref <200)
HDL: 54 mg/dL (ref >=50)
LDL Cholesterol (Calc): 72 mg/dL (calc)
Non-HDL Cholesterol (Calc): 85 mg/dL (calc) (ref <130)
Total CHOL/HDL Ratio: 2.6 (calc) (ref <5.0)
Triglycerides: 46 mg/dL (ref <150)

## 2020-04-12 LAB — CBC
HCT: 31.7 % — ABNORMAL LOW (ref 35.0–45.0)
Hemoglobin: 10.3 g/dL — ABNORMAL LOW (ref 11.7–15.5)
MCH: 28.2 pg (ref 27.0–33.0)
MCHC: 32.5 g/dL (ref 32.0–36.0)
MCV: 86.8 fL (ref 80.0–100.0)
MPV: 9.4 fL (ref 7.5–12.5)
Platelets: 280 10*3/uL (ref 140–400)
RBC: 3.65 10*6/uL — ABNORMAL LOW (ref 3.80–5.10)
RDW: 12.6 % (ref 11.0–15.0)
WBC: 5.5 10*3/uL (ref 3.8–10.8)

## 2020-04-12 LAB — TSH: TSH: 1.06 mIU/L

## 2021-04-02 ENCOUNTER — Other Ambulatory Visit: Payer: Self-pay | Admitting: Internal Medicine

## 2021-04-03 LAB — LIPID PANEL
Cholesterol: 143 mg/dL (ref <200)
HDL: 49 mg/dL — ABNORMAL LOW (ref >=50)
LDL Cholesterol (Calc): 82 mg/dL (calc)
Non-HDL Cholesterol (Calc): 94 mg/dL (calc) (ref <130)
Total CHOL/HDL Ratio: 2.9 (calc) (ref <5.0)
Triglycerides: 50 mg/dL (ref <150)

## 2021-04-03 LAB — CBC
HCT: 32.3 % — ABNORMAL LOW (ref 35.0–45.0)
Hemoglobin: 10.6 g/dL — ABNORMAL LOW (ref 11.7–15.5)
MCH: 28.8 pg (ref 27.0–33.0)
MCHC: 32.8 g/dL (ref 32.0–36.0)
MCV: 87.8 fL (ref 80.0–100.0)
MPV: 9.7 fL (ref 7.5–12.5)
Platelets: 243 10*3/uL (ref 140–400)
RBC: 3.68 10*6/uL — ABNORMAL LOW (ref 3.80–5.10)
RDW: 12.1 % (ref 11.0–15.0)
WBC: 5.1 10*3/uL (ref 3.8–10.8)

## 2021-04-03 LAB — COMPLETE METABOLIC PANEL WITH GFR
AG Ratio: 1.4 (calc) (ref 1.0–2.5)
ALT: 10 U/L (ref 6–29)
AST: 17 U/L (ref 10–30)
Albumin: 4.1 g/dL (ref 3.6–5.1)
Alkaline phosphatase (APISO): 49 U/L (ref 31–125)
BUN: 13 mg/dL (ref 7–25)
CO2: 25 mmol/L (ref 20–32)
Calcium: 9.1 mg/dL (ref 8.6–10.2)
Chloride: 106 mmol/L (ref 98–110)
Creat: 0.93 mg/dL (ref 0.50–0.97)
Globulin: 2.9 g/dL (calc) (ref 1.9–3.7)
Glucose, Bld: 82 mg/dL (ref 65–99)
Potassium: 4 mmol/L (ref 3.5–5.3)
Sodium: 137 mmol/L (ref 135–146)
Total Bilirubin: 0.4 mg/dL (ref 0.2–1.2)
Total Protein: 7 g/dL (ref 6.1–8.1)
eGFR: 82 mL/min/{1.73_m2} (ref >=60)

## 2021-04-03 LAB — VITAMIN D 25 HYDROXY (VIT D DEFICIENCY, FRACTURES): Vit D, 25-Hydroxy: 20 ng/mL — ABNORMAL LOW (ref 30–100)

## 2021-04-03 LAB — TSH: TSH: 0.95 mIU/L

## 2022-09-30 ENCOUNTER — Other Ambulatory Visit: Payer: Self-pay | Admitting: Internal Medicine

## 2022-10-02 LAB — URINE CULTURE
MICRO NUMBER:: 15182573
SPECIMEN QUALITY:: ADEQUATE

## 2023-01-21 ENCOUNTER — Other Ambulatory Visit (HOSPITAL_COMMUNITY)
Admission: RE | Admit: 2023-01-21 | Discharge: 2023-01-21 | Disposition: A | Discharge status: Home / Self Care | Payer: Medicare Other | Source: Ambulatory Visit | Attending: Student | Admitting: Student

## 2023-01-21 ENCOUNTER — Encounter: Payer: Self-pay | Admitting: Student

## 2023-01-21 ENCOUNTER — Ambulatory Visit: Payer: Medicare Other | Admitting: Student

## 2023-01-21 VITALS — BP 164/92 | HR 64 | Ht 66.0 in | Wt 208.3 lb

## 2023-01-21 DIAGNOSIS — Z01419 Encounter for gynecological examination (general) (routine) without abnormal findings: Secondary | ICD-10-CM | POA: Diagnosis present

## 2023-01-21 DIAGNOSIS — R112 Nausea with vomiting, unspecified: Secondary | ICD-10-CM | POA: Diagnosis not present

## 2023-01-21 DIAGNOSIS — N63 Unspecified lump in unspecified breast: Secondary | ICD-10-CM

## 2023-01-21 DIAGNOSIS — Z1151 Encounter for screening for human papillomavirus (HPV): Secondary | ICD-10-CM | POA: Insufficient documentation

## 2023-01-21 DIAGNOSIS — N6321 Unspecified lump in the left breast, upper outer quadrant: Secondary | ICD-10-CM | POA: Diagnosis not present

## 2023-01-21 LAB — POCT URINE PREGNANCY: Preg Test, Ur: NEGATIVE

## 2023-01-21 MED ORDER — OMEPRAZOLE 10 MG PO CPDR: 10.0000 mg | DELAYED_RELEASE_CAPSULE | Freq: Every day | ORAL | 1 refills | Status: AC

## 2023-01-21 MED ORDER — ONDANSETRON 4 MG PO TBDP: 4.0000 mg | ORAL_TABLET | Freq: Four times a day (QID) | ORAL | 0 refills | Status: AC | PRN

## 2023-01-21 NOTE — Progress Notes (Signed)
ANNUAL EXAM Patient name: Lori Leon MRN [id number]  Date of birth: [birth year] Chief Complaint:   Gynecologic Exam  History of Present Illness:   Lori Leon is a 37 y.o. G52P3003 African-American female being seen today for a routine annual exam.  Current complaints: nausea and vomiting that started about a month ago. This occurs at different times in the day and is sometimes occurring without a recent meal and other times is within 1-2 hours of eating. Patient denies any fevers, abdominal pain, chills or body aches.  Patient's last menstrual period was 01/07/2023. H/o BTL.    The pregnancy intention screening data noted above was reviewed. Potential methods of contraception were discussed. The patient elected to proceed with No data recorded.   Last pap 2021. Results were: NILM w/ HRHPV negative. H/O abnormal pap: no Last mammogram: n/a. Results were: N/A. Family h/o breast cancer: no Last colonoscopy: n/a. Results were: N/A. Family h/o colorectal cancer: no     01/21/2023   10:50 AM  Depression screen PHQ 2/9  Decreased Interest 0  Down, Depressed, Hopeless 0  PHQ - 2 Score 0  Altered sleeping 0  Tired, decreased energy 1  Change in appetite 0  Feeling bad or failure about yourself  0  Trouble concentrating 0  Moving slowly or fidgety/restless 0  Suicidal thoughts 0  PHQ-9 Score 1        01/21/2023   10:51 AM  GAD 7 : Generalized Anxiety Score  Nervous, Anxious, on Edge 0  Control/stop worrying 0  Worry too much - different things 0  Trouble relaxing 1  Restless 0  Easily annoyed or irritable 0  Afraid - awful might happen 0  Total GAD 7 Score 1     Review of Systems:   Pertinent items are noted in HPI Denies any headaches, blurred vision, fatigue, shortness of breath, chest pain, abdominal pain, abnormal vaginal discharge/itching/odor/irritation, problems with periods, bowel movements, urination, or intercourse unless otherwise stated above. Pertinent  History Reviewed:  Reviewed past medical,surgical, social and family history.  Reviewed problem list, medications and allergies. Physical Assessment:   Vitals:   01/21/23 1043  BP: (!) 164/92  Pulse: 64  Weight: 208 lb 4.8 oz (94.5 kg)  Height: 5\' 6"  (1.676 m)  Body mass index is 33.62 kg/m.        Physical Examination:   General appearance - well appearing, and in no distress  Mental status - alert, oriented to person, place, and time  Psych:  She has a normal mood and affect  Skin - warm and dry, normal color, no suspicious lesions noted  Chest - effort normal, all lung fields clear to auscultation bilaterally  Heart - normal rate and regular rhythm  Neck:  midline trachea, no thyromegaly or nodules  Breasts - breasts appear normal, small pea size mass palpated in upper outer quadrant of left breast, otherwise normal exam, no skin or nipple changes or  axillary nodes  Abdomen - soft, nontender, nondistended, no masses or organomegaly  Pelvic - VULVA: normal appearing vulva with no masses, tenderness or lesions  VAGINA: normal appearing vagina with normal color and discharge, no lesions  CERVIX: normal appearing cervix without discharge or lesions, no CMT  Thin prep pap is done with HR HPV cotesting  Extremities:  No swelling or varicosities noted  Chaperone present for exam  No results found for this or any previous visit (from the past 24 hour(s)).  Assessment & Plan:  1.  Women's annual routine gynecological examination - Declined blood work - Cytology - PAP( [place]) - Cervicovaginal ancillary only( [place]) - POCT urine pregnancy  2. Nausea and vomiting in adult - Encouraged patient to follow-up with primary care provider, does not seem to have gynecological source of etiology - ondansetron (ZOFRAN-ODT) 4 MG disintegrating tablet; Take 1 tablet (4 mg total) by mouth every 6 (six) hours as needed for nausea.  Dispense: 20 tablet; Refill: 0   3. Breast lump in  upper outer quadrant 4. Unspecified lump in the left breast, upper outer quadrant - [place] LIMITED ULTRASOUND INCLUDING AXILLA LEFT BREAST ; Future - [place] LIMITED ULTRASOUND INCLUDING AXILLA RIGHT BREAST; Future - MM 3D DIAGNOSTIC MAMMOGRAM UNILATERAL LEFT BREAST; Future   Labs/procedures today:   Mammogram: @ 38yo, or sooner if problems Colonoscopy: @ 38yo, or sooner if problems  Orders Placed This Encounter  Procedures   [place] LIMITED ULTRASOUND INCLUDING AXILLA LEFT BREAST    [place] LIMITED ULTRASOUND INCLUDING AXILLA RIGHT BREAST   MM 3D DIAGNOSTIC MAMMOGRAM UNILATERAL LEFT BREAST   POCT urine pregnancy    Meds:  Meds ordered this encounter  Medications   ondansetron (ZOFRAN-ODT) 4 MG disintegrating tablet    Sig: Take 1 tablet (4 mg total) by mouth every 6 (six) hours as needed for nausea.    Dispense:  20 tablet    Refill:  0    Order Specific Question:   Supervising Provider    Answer:   Reva Bores [[id number]]   omeprazole (PRILOSEC) 10 MG capsule    Sig: Take 1 capsule (10 mg total) by mouth daily.    Dispense:  45 capsule    Refill:  1    Order Specific Question:   Supervising Provider    Answer:   Reva Bores [[id number]]    Follow-up: Return in about 1 year (around 01/21/2024) for ANN.  Corlis Hove, NP 01/21/2023 5:34 PM

## 2023-01-21 NOTE — Progress Notes (Signed)
Pt is in the office for annual  Last pap 01/30/2020 LMP 01/07/23, states cycles come every month lasting 2-3 days Pt desires STD testing Hx of BTL but pt desires UPT today due to vomiting for the last 2 weeks

## 2023-01-22 ENCOUNTER — Other Ambulatory Visit: Payer: Self-pay | Admitting: Certified Nurse Midwife

## 2023-01-22 DIAGNOSIS — B9689 Other specified bacterial agents as the cause of diseases classified elsewhere: Secondary | ICD-10-CM

## 2023-01-22 LAB — CERVICOVAGINAL ANCILLARY ONLY
Bacterial Vaginitis (gardnerella): POSITIVE — AB
Candida Glabrata: NEGATIVE
Candida Vaginitis: NEGATIVE
Chlamydia: NEGATIVE
Comment: NEGATIVE
Comment: NEGATIVE
Comment: NEGATIVE
Comment: NEGATIVE
Comment: NEGATIVE
Comment: NORMAL
Neisseria Gonorrhea: NEGATIVE
Trichomonas: NEGATIVE

## 2023-01-22 MED ORDER — METRONIDAZOLE 0.75 % VA GEL: 1.0000 | Freq: Every day | VAGINAL | 1 refills | Status: AC

## 2023-01-28 LAB — CYTOLOGY - PAP
Adequacy: ABSENT
Comment: NEGATIVE
Comment: NEGATIVE
Comment: NEGATIVE
HPV 16: NEGATIVE
HPV 18 / 45: NEGATIVE
High risk HPV: POSITIVE — AB

## 2023-02-20 ENCOUNTER — Emergency Department (HOSPITAL_COMMUNITY): Payer: Medicare Other

## 2023-02-20 ENCOUNTER — Other Ambulatory Visit: Payer: Self-pay

## 2023-02-20 ENCOUNTER — Emergency Department (HOSPITAL_COMMUNITY)
Admission: EM | Admit: 2023-02-20 | Discharge: 2023-02-20 | Disposition: A | Discharge status: Home / Self Care | Payer: Medicare Other | Attending: Emergency Medicine | Admitting: Emergency Medicine

## 2023-02-20 ENCOUNTER — Encounter (HOSPITAL_COMMUNITY): Payer: Self-pay

## 2023-02-20 DIAGNOSIS — M79645 Pain in left finger(s): Secondary | ICD-10-CM | POA: Insufficient documentation

## 2023-02-20 NOTE — Discharge Instructions (Signed)
You have been seen today for your complaint of finger pain. Your imaging was reassuring and showed no abnormalities. Your discharge medications include Alternate tylenol and ibuprofen for pain. You may alternate these every 4 hours. You may take up to 800 mg of ibuprofen at a time and up to 1000 mg of tylenol. Home care instructions are as follows:  Wear the finger splint until you are able to follow-up with hand surgery Follow up with: Dr. Frazier Butt.  He is a Hydrographic surveyor.  Call to schedule follow-up appointment Please seek immediate medical care if you develop any of the following symptoms: Your finger is numb or blue. Your finger feels colder to the touch than normal. At this time there does not appear to be the presence of an emergent medical condition, however there is always the potential for conditions to change. Please read and follow the below instructions.  Do not take your medicine if  develop an itchy rash, swelling in your mouth or lips, or difficulty breathing; call 911 and seek immediate emergency medical attention if this occurs.  You may review your lab tests and imaging results in their entirety on your MyChart account.  Please discuss all results of fully with your primary care provider and other specialist at your follow-up visit.  Note: Portions of this text may have been transcribed using voice recognition software. Every effort was made to ensure accuracy; however, inadvertent computerized transcription errors may still be present.

## 2023-02-20 NOTE — Progress Notes (Signed)
Orthopedic Tech Progress Note Patient Details:  AMYIAH PAULOS 1986-02-21 [id number]  Ortho Devices Type of Ortho Device: Finger splint Ortho Device/Splint Location: Left little finger Ortho Device/Splint Interventions: Ordered, Application   Post Interventions Patient Tolerated: Well Instructions Provided: Care of device  Tonye Pearson 02/20/2023, 5:49 PM

## 2023-02-20 NOTE — ED Triage Notes (Signed)
Patient reports left 5th finger pain x 3 days. States the pain prior to three days ago was intermittent for the last few weeks. Unknown if there was an injury. Deformity to finger.

## 2023-02-20 NOTE — ED Provider Notes (Signed)
[facility] EMERGENCY DEPARTMENT AT [facility] Provider Note   CSN: [id number] Arrival date & time: 02/20/23  1629     History  Chief Complaint  Patient presents with   Finger Injury    Lori Leon is a 37 y.o. female.  With a history of GERD, anemia presenting to the ED for evaluation of left fifth finger pain.  Symptoms began approximately 3 days ago.  Described as an aching pain localized to the PIP joint.  She states there is some swelling to this joint as well.  She states she noticed the swelling approximately 4 weeks ago but did not have the pain until 3 days ago.  She denies any specific trauma.  She has been taking Aleve and a muscle relaxer for her symptoms with some improvement.  No numbness, weakness or tingling.  States her son had a similar issue for which they had to see a specialist.  HPI     Home Medications Prior to Admission medications   Medication Sig Start Date End Date Taking? Authorizing Provider  IRON PO Take 1 tablet by mouth 3 (three) times a week.    [provider]  metroNIDAZOLE (METROGEL) 0.75 % vaginal gel Place 1 Applicatorful vaginally at bedtime. Apply one applicatorful to vagina at bedtime for 5 days 01/22/23   Bernerd Limbo, CNM  omeprazole (PRILOSEC) 10 MG capsule Take 1 capsule (10 mg total) by mouth daily. 01/21/23   Corlis Hove, NP  ondansetron (ZOFRAN-ODT) 4 MG disintegrating tablet Take 1 tablet (4 mg total) by mouth every 6 (six) hours as needed for nausea. 01/21/23   Corlis Hove, NP  phentermine 15 MG capsule Take 15 mg by mouth every morning.    [provider]  valACYclovir (VALTREX) 1000 MG tablet Take 1 tablet (1,000 mg total) by mouth 2 (two) times daily. Patient not taking: Reported on 01/21/2023 04/01/20   Brock Bad, MD      Allergies    Patient has no known allergies.    Review of Systems   Review of Systems  Musculoskeletal:  Positive for joint swelling.  All other systems  reviewed and are negative.   Physical Exam Updated Vital Signs BP (!) 150/93 (BP Location: Right Arm)   Pulse 72   Temp 97.8 F (36.6 C) (Oral)   Resp 18   Ht 5\' 5"  (1.651 m)   Wt 93 kg   LMP 02/07/2023   SpO2 98%   BMI 34.11 kg/m  Physical Exam Vitals and nursing note reviewed.  Constitutional:      General: She is not in acute distress.    Appearance: Normal appearance. She is normal weight. She is not ill-appearing.  HENT:     Head: Normocephalic and atraumatic.  Pulmonary:     Effort: Pulmonary effort is normal. No respiratory distress.  Abdominal:     General: Abdomen is flat.  Musculoskeletal:        General: Normal range of motion.     Cervical back: Neck supple.     Comments: Mild swelling of the PIP of the left fifth finger.  No overlying erythema.  No warmth.  Capillary refill normal.  Sensation intact.  ROM intact.  No TTP to the flexor surface.  Skin:    General: Skin is warm and dry.  Neurological:     Mental Status: She is alert and oriented to person, place, and time.  Psychiatric:        Mood and Affect:  Mood normal.        Behavior: Behavior normal.     ED Results / Procedures / Treatments   Labs (all labs ordered are listed, but only abnormal results are displayed) Labs Reviewed - No data to display  EKG None  Radiology DG Finger Little Left  Result Date: 02/20/2023 CLINICAL DATA:  Left small finger pain EXAM: LEFT FINGER(S) - 2+ VIEW COMPARISON:  None Available. FINDINGS: There is no evidence of fracture or dislocation. There is no evidence of arthropathy or other focal bone abnormality. Soft tissues are unremarkable. IMPRESSION: Negative. Electronically Signed   By: Duanne Guess D.O.   On: 02/20/2023 17:15    Procedures Procedures    Medications Ordered in ED Medications - No data to display  ED Course/ Medical Decision Making/ A&P                                 Medical Decision Making Amount and/or Complexity of Data  Reviewed Radiology: ordered.  This patient presents to the ED for concern of left fifth finger pain, this involves an extensive number of treatment options.  The differential diagnosis includes fracture, strain, sprain, flexor tendon contracture  Afebrile, hypertensive but hemodynamically stable.  37 year old female presenting for evaluation of atraumatic left fifth finger pain for the past 3 days.  On exam, there is mild swelling of the PIP joint.  No evidence of infection.  X-ray imaging negative for acute osseous abnormalities.  She was encouraged to alternate Tylenol and ibuprofen at home for pain.  She was given a finger splint in the emergency department for symptomatic relief.  She was given contact information for hand surgery and encouraged to follow-up.  She was given return precautions.  Stable at discharge.  At this time there does not appear to be any evidence of an acute emergency medical condition and the patient appears stable for discharge with appropriate outpatient follow up. Diagnosis was discussed with patient who verbalizes understanding of care plan and is agreeable to discharge. I have discussed return precautions with patient who verbalizes understanding. Patient encouraged to follow-up with their PCP within 1 week. All questions answered.  Note: Portions of this report may have been transcribed using voice recognition software. Every effort was made to ensure accuracy; however, inadvertent computerized transcription errors may still be present.        Final Clinical Impression(s) / ED Diagnoses Final diagnoses:  Pain of finger of left hand    Rx / DC Orders ED Discharge Orders     None         Michelle Piper, Cordelia Poche 02/20/23 1733    Terrilee Files, MD 02/21/23 1005

## 2023-03-26 ENCOUNTER — Other Ambulatory Visit: Payer: Medicare Other

## 2023-03-26 ENCOUNTER — Encounter: Payer: Self-pay | Admitting: Student

## 2023-03-30 ENCOUNTER — Ambulatory Visit
Admission: RE | Admit: 2023-03-30 | Discharge: 2023-03-30 | Disposition: A | Discharge status: Home / Self Care | Payer: Medicare Other | Source: Ambulatory Visit | Attending: Student | Admitting: Student

## 2023-03-30 DIAGNOSIS — N6321 Unspecified lump in the left breast, upper outer quadrant: Secondary | ICD-10-CM

## 2023-03-30 DIAGNOSIS — N63 Unspecified lump in unspecified breast: Secondary | ICD-10-CM
